# Patient Record
Sex: Female | Born: 1950 | Race: White | Hispanic: No | Marital: Single | Smoking: Never smoker
Health system: Southern US, Community
[De-identification: ages and names within clinical notes are randomized; demographics above are authoritative.]

## PROBLEM LIST (undated history)

## (undated) DIAGNOSIS — D649 Anemia, unspecified: Secondary | ICD-10-CM

## (undated) DIAGNOSIS — E039 Hypothyroidism, unspecified: Secondary | ICD-10-CM

## (undated) DIAGNOSIS — K649 Unspecified hemorrhoids: Secondary | ICD-10-CM

## (undated) DIAGNOSIS — F419 Anxiety disorder, unspecified: Secondary | ICD-10-CM

## (undated) HISTORY — PX: BREAST SURGERY: SHX581

## (undated) HISTORY — DX: Unspecified hemorrhoids: K64.9

## (undated) HISTORY — PX: STAPEDECTOMY: SHX2435

## (undated) HISTORY — DX: Anxiety disorder, unspecified: F41.9

## (undated) HISTORY — DX: Hypothyroidism, unspecified: E03.9

---

## 2005-05-16 HISTORY — PX: COLONOSCOPY: SHX174

## 2007-11-04 ENCOUNTER — Emergency Department (HOSPITAL_COMMUNITY): Admission: EM | Admit: 2007-11-04 | Discharge: 2007-11-04 | Payer: Self-pay | Admitting: Emergency Medicine

## 2007-11-07 ENCOUNTER — Emergency Department (HOSPITAL_COMMUNITY): Admission: EM | Admit: 2007-11-07 | Discharge: 2007-11-07 | Payer: Self-pay | Admitting: Emergency Medicine

## 2010-02-10 ENCOUNTER — Encounter: Admission: RE | Admit: 2010-02-10 | Discharge: 2010-02-10 | Payer: Self-pay | Admitting: Internal Medicine

## 2011-02-10 LAB — URINALYSIS, ROUTINE W REFLEX MICROSCOPIC
Bilirubin Urine: NEGATIVE
Glucose, UA: NEGATIVE
Ketones, ur: NEGATIVE
Leukocytes, UA: NEGATIVE
Nitrite: NEGATIVE
Specific Gravity, Urine: <1.005 — ABNORMAL LOW
pH: 5

## 2011-02-10 LAB — CSF CELL COUNT WITH DIFFERENTIAL
RBC Count, CSF: 18 — ABNORMAL HIGH
WBC, CSF: <10 — ABNORMAL HIGH

## 2011-02-10 LAB — BASIC METABOLIC PANEL
BUN: 10
Chloride: 102
Creatinine, Ser: 0.76
Creatinine, Ser: 0.92
GFR calc Af Amer: >60
GFR calc non Af Amer: >60
Glucose, Bld: 102 — ABNORMAL HIGH
Potassium: 3.7
Potassium: 4.2
Sodium: 134 — ABNORMAL LOW

## 2011-02-10 LAB — URINE MICROSCOPIC-ADD ON

## 2011-02-10 LAB — CSF CULTURE W GRAM STAIN: Culture: NO GROWTH

## 2011-02-10 LAB — CBC
HCT: 37.8
Hemoglobin: 12.2
MCV: 85.4
MCV: 86
Platelets: 178
RBC: 4.07
RDW: 13.6
WBC: 4.2
WBC: 5.2

## 2011-02-10 LAB — DIFFERENTIAL
Basophils Absolute: 0
Eosinophils Absolute: 0
Eosinophils Absolute: 0
Eosinophils Relative: 0
Lymphocytes Relative: 19
Lymphs Abs: 0.8
Lymphs Abs: 2.6
Monocytes Absolute: 0.5
Monocytes Relative: 9
Neutrophils Relative %: 40 — ABNORMAL LOW
Neutrophils Relative %: 74

## 2011-02-10 LAB — AFB CULTURE WITH SMEAR (NOT AT ARMC)

## 2011-02-10 LAB — FUNGUS CULTURE W SMEAR

## 2011-02-10 LAB — PROTEIN AND GLUCOSE, CSF: Glucose, CSF: 56

## 2011-05-19 ENCOUNTER — Other Ambulatory Visit (HOSPITAL_COMMUNITY): Payer: Self-pay | Admitting: Internal Medicine

## 2011-05-19 DIAGNOSIS — Z139 Encounter for screening, unspecified: Secondary | ICD-10-CM

## 2011-05-24 ENCOUNTER — Ambulatory Visit (HOSPITAL_COMMUNITY)
Admission: RE | Admit: 2011-05-24 | Discharge: 2011-05-24 | Disposition: A | Discharge status: Home / Self Care | Payer: Federal, State, Local not specified - PPO | Source: Ambulatory Visit | Attending: Internal Medicine | Admitting: Internal Medicine

## 2011-05-24 DIAGNOSIS — Z1231 Encounter for screening mammogram for malignant neoplasm of breast: Secondary | ICD-10-CM | POA: Insufficient documentation

## 2011-05-24 DIAGNOSIS — Z139 Encounter for screening, unspecified: Secondary | ICD-10-CM

## 2012-10-01 ENCOUNTER — Other Ambulatory Visit (HOSPITAL_COMMUNITY): Payer: Self-pay | Admitting: Internal Medicine

## 2012-10-01 DIAGNOSIS — Z139 Encounter for screening, unspecified: Secondary | ICD-10-CM

## 2012-10-09 ENCOUNTER — Ambulatory Visit (HOSPITAL_COMMUNITY)
Admission: RE | Admit: 2012-10-09 | Discharge: 2012-10-09 | Disposition: A | Discharge status: Home / Self Care | Payer: Federal, State, Local not specified - PPO | Source: Ambulatory Visit | Attending: Internal Medicine | Admitting: Internal Medicine

## 2012-10-09 DIAGNOSIS — Z139 Encounter for screening, unspecified: Secondary | ICD-10-CM

## 2012-10-09 DIAGNOSIS — Z1231 Encounter for screening mammogram for malignant neoplasm of breast: Secondary | ICD-10-CM | POA: Insufficient documentation

## 2013-05-31 ENCOUNTER — Other Ambulatory Visit (HOSPITAL_COMMUNITY): Payer: Self-pay | Admitting: Internal Medicine

## 2013-05-31 DIAGNOSIS — N644 Mastodynia: Secondary | ICD-10-CM

## 2013-06-12 ENCOUNTER — Ambulatory Visit (HOSPITAL_COMMUNITY)
Admission: RE | Admit: 2013-06-12 | Discharge: 2013-06-12 | Disposition: A | Discharge status: Home / Self Care | Payer: Federal, State, Local not specified - PPO | Source: Ambulatory Visit | Attending: Internal Medicine | Admitting: Internal Medicine

## 2013-06-12 ENCOUNTER — Other Ambulatory Visit (HOSPITAL_COMMUNITY): Payer: Self-pay | Admitting: Internal Medicine

## 2013-06-12 DIAGNOSIS — N6489 Other specified disorders of breast: Secondary | ICD-10-CM | POA: Insufficient documentation

## 2013-06-12 DIAGNOSIS — N644 Mastodynia: Secondary | ICD-10-CM

## 2013-06-20 ENCOUNTER — Encounter: Payer: Self-pay | Admitting: Gastroenterology

## 2013-06-20 ENCOUNTER — Encounter (INDEPENDENT_AMBULATORY_CARE_PROVIDER_SITE_OTHER): Payer: Self-pay

## 2013-06-20 ENCOUNTER — Ambulatory Visit (INDEPENDENT_AMBULATORY_CARE_PROVIDER_SITE_OTHER): Payer: Federal, State, Local not specified - PPO | Admitting: Gastroenterology

## 2013-06-20 VITALS — BP 131/73 | HR 52 | Temp 97.9°F | Ht 64.0 in | Wt 133.4 lb

## 2013-06-20 DIAGNOSIS — K648 Other hemorrhoids: Secondary | ICD-10-CM | POA: Insufficient documentation

## 2013-06-20 NOTE — Assessment & Plan Note (Signed)
R ANT BAND IN 2 WEEKS. WILL LIKELY NEED 4 BANDS.

## 2013-06-20 NOTE — Progress Notes (Signed)
   Subjective:    Patient ID: Stacey Tucker, female    DOB: 1950/05/29, 63 y.o.   MRN: [id number] Stacey Cahill, MD  HPI HAVE BOUTS OF BLEEDING EVERY DAY AND THEN MAY STOP FOR 10 YEARS. BMs: 1/DAY. NO RECTAL  PRESSURE, PAIN, ITCHING, BURNING, OR SOILING. PT DENIES FEVER, CHILLS, nausea, vomiting, melena, diarrhea, constipation, abd pain, problems swallowing, OR heartburn or indigestion.    Past Medical History  Diagnosis Date  . Hemorrhoids   . Hypothyroidism AGE 57  . Anxiety     Past Surgical History  Procedure Laterality Date  . Colonoscopy  2007    [facility]     Allergies  Allergen Reactions  . Codeine   . Sulfa Antibiotics     Current Outpatient Prescriptions  Medication Sig Dispense Refill  . KRILL OIL PO Take by mouth daily.      Marland Kitchen levothyroxine (SYNTHROID, LEVOTHROID) 100 MCG tablet Take 100 mcg by mouth daily before breakfast.       . Multiple Vitamin (MULTIVITAMIN) capsule Take 1 capsule by mouth daily.      Marland Kitchen venlafaxine XR (EFFEXOR-XR) 75 MG 24 hr capsule Take 75 mg by mouth daily with breakfast.        History  Substance Use Topics  . Smoking status: Never Smoker   . Smokeless tobacco: Not on file  . Alcohol Use: No  NO CHILDREN      Review of Systems PER HPI OTHERWISE ALL SYSTEMS ARE NEGATIVE.     Objective:   Physical Exam  Vitals reviewed. Constitutional: She is oriented to person, place, and time. She appears well-nourished. No distress.  HENT:  Head: Normocephalic and atraumatic.  Mouth/Throat: Oropharynx is clear and moist. No oropharyngeal exudate.  Eyes: Pupils are equal, round, and reactive to light. No scleral icterus.  Neck: Normal range of motion. Neck supple.  Cardiovascular: Normal rate, regular rhythm and normal heart sounds.   Pulmonary/Chest: Effort normal and breath sounds normal. No respiratory distress.  Abdominal: Soft. Bowel sounds are normal. She exhibits no distension. There is no tenderness.  Musculoskeletal: She exhibits no  edema.  Lymphadenopathy:    She has no cervical adenopathy.  Neurological: She is alert and oriented to person, place, and time.  Psychiatric: She has a normal mood and affect.          Assessment & Plan:

## 2013-06-20 NOTE — Patient Instructions (Signed)
FOLLOW A HIGH FIBER DIET. AVOID ITEMS THAT CAUSE BLOATING AND GAS. SEE INFO BELOW.  DRINK WATER TO KEEP YOUR URINE LIGHT YELLOW.  CONSIDER USING FIBER POWDER OR 1 PACKET ONCE DAILY FOR 3 DAYS THEN TWICE DAILY FOR 3 DAYS THEN THREE TIMES A DAY. AVOID HIGHER DOSES IF IT CAUSES BLOATING & GAS.  FOLLOW UP IN 2 WEEKS.    High-Fiber Diet A high-fiber diet changes your normal diet to include more whole grains, legumes, fruits, and vegetables. Changes in the diet involve replacing refined carbohydrates with unrefined foods. The calorie level of the diet is essentially unchanged. The Dietary Reference Intake (recommended amount) for adult males is 38 grams per day. For adult females, it is 25 grams per day. Pregnant and lactating women should consume 28 grams of fiber per day. Fiber is the intact part of a plant that is not broken down during digestion. Functional fiber is fiber that has been isolated from the plant to provide a beneficial effect in the body. PURPOSE  Increase stool bulk.   Ease and regulate bowel movements.   Lower cholesterol.  INDICATIONS THAT YOU NEED MORE FIBER  Constipation and hemorrhoids.   Uncomplicated diverticulosis (intestine condition) and irritable bowel syndrome.   Weight management.   As a protective measure against hardening of the arteries (atherosclerosis), diabetes, and cancer.   DO NOT USE WITH:  Acute diverticulitis (intestine infection).   Partial small bowel obstructions.   Complicated diverticular disease involving bleeding, rupture (perforation), or abscess (boil, furuncle).   Presence of autonomic neuropathy (nerve damage) or gastroparesis (stomach cannot empty itself).    GUIDELINES FOR INCREASING FIBER IN THE DIET  Start adding fiber to the diet slowly. A gradual increase of about 5 more grams (2 slices of whole-wheat bread, 2 servings of most fruits or vegetables, or 1 bowl of high-fiber cereal) per day is best. Too rapid an increase in  fiber may result in constipation, flatulence, and bloating.   Drink enough water and fluids to keep your urine clear or pale yellow. Water, juice, or caffeine-free drinks are recommended. Not drinking enough fluid may cause constipation.   Eat a variety of high-fiber foods rather than one type of fiber.   Try to increase your intake of fiber through using high-fiber foods rather than fiber pills or supplements that contain small amounts of fiber.   The goal is to change the types of food eaten. Do not supplement your present diet with high-fiber foods, but replace foods in your present diet.    INCLUDE A VARIETY OF FIBER SOURCES  Replace refined and processed grains with whole grains, canned fruits with fresh fruits, and incorporate other fiber sources. White rice, white breads, and most bakery goods contain little or no fiber.   Brown whole-grain rice, buckwheat oats, and many fruits and vegetables are all good sources of fiber. These include: broccoli, Brussels sprouts, cabbage, cauliflower, beets, sweet potatoes, white potatoes (skin on), carrots, tomatoes, eggplant, squash, berries, fresh fruits, and dried fruits.   Cereals appear to be the richest source of fiber. Cereal fiber is found in whole grains and bran. Bran is the fiber-rich outer coat of cereal grain, which is largely removed in refining. In whole-grain cereals, the bran remains. In breakfast cereals, the largest amount of fiber is found in those with "bran" in their names. The fiber content is sometimes indicated on the label.   You may need to include additional fruits and vegetables each day.   In baking, for 1  cup white flour, you may use the following substitutions:   1 cup whole-wheat flour minus 2 tablespoons.   1/2 cup white flour plus 1/2 cup whole-wheat flour.

## 2013-06-20 NOTE — Progress Notes (Signed)
CONSTIPATION: NO DIARRHEA: NO  STRAINS WITH BMs: YES  TIME SPENT ON TOILET: 4-5 MINS TISSUE POKES OUT OF RECTUM: YES- PUSHES IT BACK IN FIBER SUPPLEMENTS: NO  GLASSES OF WATER/DAY: 6-8: NO   ADDITIONAL QUESTIONS:  LATEX ALLERGY: NO PREGNANT: NO ERECTILE DYSFUNCTION MEDS OR NITRATES: NO ANTICOAGULATION/ANTIPLATELET MEDS: NO DIAGNOSED WITH CROHN'S DISEASE, PROCTITIS, PORTAL HTN, OR ANAL/RECTAL CA: NO TAKING IMMUNOSUPPRESSANTS/XRT: NO   PLAN: 1. ANOSCOPY/[facility] BANDING TODAY   PROCEDURE TECHNIQUE: BENEFITS RISK EXPLAINED TO PT. ANOSCOPY PERFORMED. BULGING INTERNAL HEMORRHOID COLUMN IN THE LEFT LATERAL, R POSTERIOR AND ANTERIOR COLUMNS. ONE [facility] BAND PLACED IN RIGHT POSTERIOR POSITION. POST-BANDING RECTAL EXAM REVEALED GOOD PLACEMENT. EXAM NON-TENDER.

## 2013-06-24 NOTE — Progress Notes (Signed)
cc'd to pcp 

## 2013-06-25 NOTE — Progress Notes (Signed)
Pt is aware of next banding with [facility]

## 2013-07-04 ENCOUNTER — Ambulatory Visit (INDEPENDENT_AMBULATORY_CARE_PROVIDER_SITE_OTHER): Payer: Federal, State, Local not specified - PPO | Admitting: Gastroenterology

## 2013-07-04 ENCOUNTER — Encounter: Payer: Self-pay | Admitting: Gastroenterology

## 2013-07-04 VITALS — BP 109/69 | HR 55 | Temp 97.6°F | Wt 131.8 lb

## 2013-07-04 DIAGNOSIS — K648 Other hemorrhoids: Secondary | ICD-10-CM

## 2013-07-04 NOTE — Progress Notes (Addendum)
SYMPTOMS: no RECTAL BLEEDING, RARE PAIN. STILL COMING OUT.  CONSTIPATION: NO DIARRHEA: NO  STRAINS WITH BMs: NO  TIME SPENT ON TOILET: 5 MINS TISSUE POKES OUT OF RECTUM: YES- PUSH IT BACK IN FIBER SUPPLEMENTS: NO  GLASSES OF WATER/DAY: 6-8: NO   ADDITIONAL QUESTIONS:  LATEX ALLERGY: NO PREGNANT: NO ERECTILE DYSFUNCTION MEDS OR NITRATES: NO ANTICOAGULATION/ANTIPLATELET MEDS: NO DIAGNOSED WITH CROHN'S DISEASE, PROCTITIS, PORTAL HTN, OR ANAL/RECTAL CA: NO TAKING IMMUNOSUPPRESSANTS/XRT: NO  PLAN: 1. [facility] BANDING TODAY   PROCEDURE TECHNIQUE: BENEFITS RISK EXPLAINED TO PT. ONE [facility] BAND PLACED IN RIGHT ANTERIOR POSITION. POST-BANDING RECTAL EXAM REVEALED GOOD PLACEMENT. EXAM NON-TENDER.

## 2013-07-04 NOTE — Assessment & Plan Note (Signed)
R ANT BAND TODAY

## 2013-07-04 NOTE — Patient Instructions (Addendum)
FOLLOW A HIGH FIBER DIET. AVOID ITEMS THAT CAUSE BLOATING AND GAS.   DRINK WATER TO KEEP YOUR URINE LIGHT YELLOW.   FOLLOW UP IN 3 WEEKS.

## 2013-08-05 ENCOUNTER — Telehealth: Payer: Self-pay | Admitting: Gastroenterology

## 2013-08-05 NOTE — Telephone Encounter (Signed)
PLEASE CALL PT. [place] [facility] BANDING FOR APR 8 AT 1130.

## 2013-08-07 ENCOUNTER — Ambulatory Visit: Payer: Federal, State, Local not specified - PPO | Admitting: Gastroenterology

## 2013-08-21 ENCOUNTER — Encounter: Payer: Self-pay | Admitting: Gastroenterology

## 2013-08-21 ENCOUNTER — Ambulatory Visit (INDEPENDENT_AMBULATORY_CARE_PROVIDER_SITE_OTHER): Payer: Federal, State, Local not specified - PPO | Admitting: Gastroenterology

## 2013-08-21 ENCOUNTER — Encounter (INDEPENDENT_AMBULATORY_CARE_PROVIDER_SITE_OTHER): Payer: Self-pay

## 2013-08-21 VITALS — BP 107/72 | HR 57 | Temp 97.6°F | Ht 65.0 in | Wt 133.0 lb

## 2013-08-21 DIAGNOSIS — K648 Other hemorrhoids: Secondary | ICD-10-CM

## 2013-08-21 NOTE — Progress Notes (Signed)
SYMPTOMS: RECTAL BLEEDING: a tiny amount. Feel like bottom is better.   CONSTIPATION: NO DIARRHEA: no  STRAINS WITH BMs: no  TIME SPENT ON TOILET: 3 MINS TISSUE POKES OUT OF RECTUM: YES- very rare FIBER SUPPLEMENTS: NO  GLASSES OF WATER/DAY: 6-8   ADDITIONAL QUESTIONS:  LATEX ALLERGY: NO PREGNANT: NO ERECTILE DYSFUNCTION MEDS OR NITRATES: NO ANTICOAGULATION/ANTIPLATELET MEDS: NO DIAGNOSED WITH CROHN'S DISEASE, PROCTITIS, PORTAL HTN, OR ANAL/RECTAL CA: NO TAKING IMMUNOSUPPRESSANTS/XRT: NO  PLAN: 1. [facility] BANDING   PROCEDURE TECHNIQUE: BENEFITS RISK EXPLAINED TO PT. ONE [facility] BAND PLACED IN LEFT LATERAL POSITION. POST-BANDING RECTAL EXAM REVEALED GOOD PLACEMENT. EXAM NON-TENDER

## 2013-08-21 NOTE — Assessment & Plan Note (Signed)
DRINK WATER EAT FIBER OPV IN 4 WEEKS. WILL DISCUSS WHETHER ANOTHER BAND IS NEEDED.

## 2013-08-21 NOTE — Patient Instructions (Signed)
DRINK WATER TO KEEP YOUR URINE LIGHT YELLOW.  FOLLOW A HIGH FIBER DIET. AVOID ITEMS THAT CAUSE BLOATING AND GAS.   FOLLOW UP IN 4 WEEKS.

## 2013-08-21 NOTE — Progress Notes (Signed)
cc'd to pcp 

## 2013-08-22 NOTE — Progress Notes (Signed)
Pt is aware of OV on 5/7 at 0945 with [facility]

## 2013-09-19 ENCOUNTER — Ambulatory Visit: Payer: Federal, State, Local not specified - PPO | Admitting: Gastroenterology

## 2013-09-26 ENCOUNTER — Encounter (INDEPENDENT_AMBULATORY_CARE_PROVIDER_SITE_OTHER): Payer: Self-pay

## 2013-09-26 ENCOUNTER — Ambulatory Visit (INDEPENDENT_AMBULATORY_CARE_PROVIDER_SITE_OTHER): Payer: Federal, State, Local not specified - PPO | Admitting: Gastroenterology

## 2013-09-26 ENCOUNTER — Encounter: Payer: Self-pay | Admitting: Gastroenterology

## 2013-09-26 VITALS — BP 121/78 | HR 59 | Temp 98.3°F | Ht 65.0 in | Wt 132.2 lb

## 2013-09-26 DIAGNOSIS — K648 Other hemorrhoids: Secondary | ICD-10-CM

## 2013-09-26 NOTE — Progress Notes (Signed)
Reminder in epic °

## 2013-09-26 NOTE — Assessment & Plan Note (Signed)
SX RESOLVED  DRINK WATER EAT FIBER OPV IN 6 MOS

## 2013-09-26 NOTE — Patient Instructions (Signed)
DRINK WATER TO KEEP YOUR URINE LIGHT YELLOW.  FOLLOW A HIGH FIBER DIET.   IF YOU NEED EXTRA FIBER YOU COULD USE FIBER POWDER OR 1 PACKET ONCE DAILY FOR 3 DAYS THEN TWICE DAILY.  AVOID HIGHER DOSES IF IT CAUSES BLOATING & GAS.  FOLLOW UP IN 6 MOS.   High-Fiber Diet A high-fiber diet changes your normal diet to include more whole grains, legumes, fruits, and vegetables. Changes in the diet involve replacing refined carbohydrates with unrefined foods. The calorie level of the diet is essentially unchanged. The Dietary Reference Intake (recommended amount) for adult males is 38 grams per day. For adult females, it is 25 grams per day. Pregnant and lactating women should consume 28 grams of fiber per day. Fiber is the intact part of a plant that is not broken down during digestion. Functional fiber is fiber that has been isolated from the plant to provide a beneficial effect in the body. PURPOSE  Increase stool bulk.   Ease and regulate bowel movements.   Lower cholesterol.  INDICATIONS THAT YOU NEED MORE FIBER  Constipation and hemorrhoids.   Uncomplicated diverticulosis (intestine condition) and irritable bowel syndrome.   Weight management.   As a protective measure against hardening of the arteries (atherosclerosis), diabetes, and cancer.   GUIDELINES FOR INCREASING FIBER IN THE DIET  Start adding fiber to the diet slowly. A gradual increase of about 5 more grams (2 slices of whole-wheat bread, 2 servings of most fruits or vegetables, or 1 bowl of high-fiber cereal) per day is best. Too rapid an increase in fiber may result in constipation, flatulence, and bloating.   Drink enough water and fluids to keep your urine clear or pale yellow. Water, juice, or caffeine-free drinks are recommended. Not drinking enough fluid may cause constipation.   Eat a variety of high-fiber foods rather than one type of fiber.   Try to increase your intake of fiber through using high-fiber foods rather  than fiber pills or supplements that contain small amounts of fiber.   The goal is to change the types of food eaten. Do not supplement your present diet with high-fiber foods, but replace foods in your present diet.  INCLUDE A VARIETY OF FIBER SOURCES  Replace refined and processed grains with whole grains, canned fruits with fresh fruits, and incorporate other fiber sources. White rice, white breads, and most bakery goods contain little or no fiber.   Brown whole-grain rice, buckwheat oats, and many fruits and vegetables are all good sources of fiber. These include: broccoli, Brussels sprouts, cabbage, cauliflower, beets, sweet potatoes, white potatoes (skin on), carrots, tomatoes, eggplant, squash, berries, fresh fruits, and dried fruits.   Cereals appear to be the richest source of fiber. Cereal fiber is found in whole grains and bran. Bran is the fiber-rich outer coat of cereal grain, which is largely removed in refining. In whole-grain cereals, the bran remains. In breakfast cereals, the largest amount of fiber is found in those with "bran" in their names. The fiber content is sometimes indicated on the label.   You may need to include additional fruits and vegetables each day.   In baking, for 1 cup white flour, you may use the following substitutions:   1 cup whole-wheat flour minus 2 tablespoons.   1/2 cup white flour plus 1/2 cup whole-wheat flour.

## 2013-09-26 NOTE — Progress Notes (Signed)
   Subjective:    Patient ID: Stacey Tucker, female    DOB: Oct 16, 1950, 63 y.o.   MRN: [id number]  Delphina Cahill, MD  HPI LAST TIME HAD A BAND. IT HURT FOR 2-3 WEEKS AFTER, BUT NOW SX RESOLVED. NO RECTAL BLEEDING, PRESSURE, PAIN, ITCHING, BURNING, OR SOILING.  Past Medical History  Diagnosis Date  . Hemorrhoids   . Hypothyroidism AGE 42  . Anxiety    Past Surgical History  Procedure Laterality Date  . Colonoscopy  2007    [facility]     Allergies  Allergen Reactions  . Codeine   . Sulfa Antibiotics    Current Outpatient Prescriptions  Medication Sig Dispense Refill  . KRILL OIL PO Take by mouth daily.      Marland Kitchen levothyroxine (SYNTHROID, LEVOTHROID) 100 MCG tablet Take 100 mcg by mouth daily before breakfast.       . Multiple Vitamin (MULTIVITAMIN) capsule Take 1 capsule by mouth daily.      Marland Kitchen venlafaxine XR (EFFEXOR-XR) 75 MG 24 hr capsule Take 75 mg by mouth daily with breakfast.          Review of Systems     Objective:   Physical Exam  Vitals reviewed. Constitutional: She is oriented to person, place, and time. She appears well-nourished. No distress.  HENT:  Head: Normocephalic and atraumatic.  Mouth/Throat: Oropharynx is clear and moist. No oropharyngeal exudate.  Eyes: Pupils are equal, round, and reactive to light. No scleral icterus.  Neck: Normal range of motion. Neck supple.  Cardiovascular: Normal rate, regular rhythm and normal heart sounds.   Pulmonary/Chest: Effort normal and breath sounds normal.  Abdominal: Soft. Bowel sounds are normal. She exhibits no distension. There is no tenderness.  Lymphadenopathy:    She has no cervical adenopathy.  Neurological: She is alert and oriented to person, place, and time.  NO FOCAL DEFICITS   Psychiatric: She has a normal mood and affect.          Assessment & Plan:

## 2013-09-30 NOTE — Progress Notes (Signed)
cc'd to pcp 

## 2014-02-25 ENCOUNTER — Encounter: Payer: Self-pay | Admitting: Gastroenterology

## 2014-05-19 ENCOUNTER — Other Ambulatory Visit (HOSPITAL_COMMUNITY): Payer: Self-pay | Admitting: Internal Medicine

## 2014-05-19 DIAGNOSIS — Z09 Encounter for follow-up examination after completed treatment for conditions other than malignant neoplasm: Secondary | ICD-10-CM

## 2014-05-27 ENCOUNTER — Ambulatory Visit (HOSPITAL_COMMUNITY)
Admission: RE | Admit: 2014-05-27 | Discharge: 2014-05-27 | Disposition: A | Discharge status: Home / Self Care | Payer: Federal, State, Local not specified - PPO | Source: Ambulatory Visit | Attending: Internal Medicine | Admitting: Internal Medicine

## 2014-05-27 DIAGNOSIS — R928 Other abnormal and inconclusive findings on diagnostic imaging of breast: Secondary | ICD-10-CM | POA: Diagnosis present

## 2014-05-27 DIAGNOSIS — Z09 Encounter for follow-up examination after completed treatment for conditions other than malignant neoplasm: Secondary | ICD-10-CM

## 2014-06-04 ENCOUNTER — Encounter (HOSPITAL_COMMUNITY): Payer: Self-pay | Admitting: Emergency Medicine

## 2014-06-04 ENCOUNTER — Ambulatory Visit (INDEPENDENT_AMBULATORY_CARE_PROVIDER_SITE_OTHER): Payer: Federal, State, Local not specified - PPO | Admitting: Gastroenterology

## 2014-06-04 ENCOUNTER — Encounter: Payer: Self-pay | Admitting: Gastroenterology

## 2014-06-04 ENCOUNTER — Emergency Department (HOSPITAL_COMMUNITY)
Admission: EM | Admit: 2014-06-04 | Discharge: 2014-06-04 | Disposition: A | Discharge status: Home / Self Care | Payer: Federal, State, Local not specified - PPO | Attending: Emergency Medicine | Admitting: Emergency Medicine

## 2014-06-04 VITALS — BP 124/74 | HR 50 | Temp 97.1°F | Ht 64.0 in | Wt 132.2 lb

## 2014-06-04 DIAGNOSIS — E039 Hypothyroidism, unspecified: Secondary | ICD-10-CM | POA: Insufficient documentation

## 2014-06-04 DIAGNOSIS — Z79899 Other long term (current) drug therapy: Secondary | ICD-10-CM | POA: Diagnosis not present

## 2014-06-04 DIAGNOSIS — K648 Other hemorrhoids: Secondary | ICD-10-CM

## 2014-06-04 DIAGNOSIS — R55 Syncope and collapse: Secondary | ICD-10-CM

## 2014-06-04 DIAGNOSIS — F419 Anxiety disorder, unspecified: Secondary | ICD-10-CM | POA: Insufficient documentation

## 2014-06-04 DIAGNOSIS — Z8719 Personal history of other diseases of the digestive system: Secondary | ICD-10-CM | POA: Insufficient documentation

## 2014-06-04 LAB — URINALYSIS, ROUTINE W REFLEX MICROSCOPIC
Bilirubin Urine: NEGATIVE
GLUCOSE, UA: NEGATIVE mg/dL
Hgb urine dipstick: NEGATIVE
Leukocytes, UA: NEGATIVE
NITRITE: NEGATIVE
Protein, ur: NEGATIVE mg/dL
Specific Gravity, Urine: >1.03 — ABNORMAL HIGH (ref 1.005–1.030)
Urobilinogen, UA: 0.2 mg/dL (ref 0.0–1.0)
pH: 5.5 (ref 5.0–8.0)

## 2014-06-04 LAB — BASIC METABOLIC PANEL
Anion gap: 6 (ref 5–15)
BUN: 21 mg/dL (ref 6–23)
CO2: 27 mmol/L (ref 19–32)
Calcium: 9.2 mg/dL (ref 8.4–10.5)
Chloride: 106 mEq/L (ref 96–112)
Creatinine, Ser: 0.89 mg/dL (ref 0.50–1.10)
GFR calc Af Amer: 79 mL/min — ABNORMAL LOW (ref >90)
GFR calc non Af Amer: 69 mL/min — ABNORMAL LOW (ref >90)
Glucose, Bld: 107 mg/dL — ABNORMAL HIGH (ref 70–99)
Potassium: 4.1 mmol/L (ref 3.5–5.1)
Sodium: 139 mmol/L (ref 135–145)

## 2014-06-04 LAB — TROPONIN I: Troponin I: <0.03 ng/mL (ref <0.031)

## 2014-06-04 LAB — CBC WITH DIFFERENTIAL/PLATELET
Basophils Absolute: 0 10*3/uL (ref 0.0–0.1)
Basophils Relative: 0 % (ref 0–1)
EOS ABS: 0 10*3/uL (ref 0.0–0.7)
Eosinophils Relative: 0 % (ref 0–5)
HCT: 34.4 % — ABNORMAL LOW (ref 36.0–46.0)
HEMOGLOBIN: 11.7 g/dL — AB (ref 12.0–15.0)
LYMPHS ABS: 1.1 10*3/uL (ref 0.7–4.0)
LYMPHS PCT: 16 % (ref 12–46)
MCH: 29.9 pg (ref 26.0–34.0)
MCHC: 34 g/dL (ref 30.0–36.0)
MCV: 88 fL (ref 78.0–100.0)
MONOS PCT: 6 % (ref 3–12)
Monocytes Absolute: 0.4 10*3/uL (ref 0.1–1.0)
NEUTROS PCT: 78 % — AB (ref 43–77)
Neutro Abs: 5.6 10*3/uL (ref 1.7–7.7)
Platelets: 226 10*3/uL (ref 150–400)
RBC: 3.91 MIL/uL (ref 3.87–5.11)
RDW: 13.1 % (ref 11.5–15.5)
WBC: 7.2 10*3/uL (ref 4.0–10.5)

## 2014-06-04 LAB — TSH: TSH: 0.682 u[IU]/mL (ref 0.350–4.500)

## 2014-06-04 MED ORDER — SODIUM CHLORIDE 0.9 % IV BOLUS (SEPSIS)
1000.0000 mL | Freq: Once | INTRAVENOUS | Status: AC
  Administered 2014-06-04: 1000 mL via INTRAVENOUS

## 2014-06-04 NOTE — Patient Instructions (Signed)
DRINK WATER TO KEEP YOUR URINE LIGHT YELLOW.  EAT FIBER.  AVOID THINGS THAT MAKE HEMORRHOIDS WORSE. SEE INFO BELOW.  PLEASE CALL WITH QUESTIONS OR CONCERNS.  FOLLOW UP IN 3 WEEKS FOR ADDITIONAL ANOSCOPY AND BANDING.  Hemorrhoids Hemorrhoids are dilated (enlarged) veins around the rectum. Sometimes clots will form in the veins. This makes them swollen and painful. These are called thrombosed hemorrhoids. Causes of hemorrhoids include:  Constipation.   Straining to have a bowel movement.   HEAVY LIFTING  HOME CARE INSTRUCTIONS  Eat a well balanced diet and drink 6 to 8 glasses of water every day to avoid constipation. You may also use a bulk laxative.   Avoid straining to have bowel movements.   Keep anal area dry and clean.   Do not use a donut shaped pillow or sit on the toilet for long periods. This increases blood pooling and pain.   Move your bowels when your body has the urge; this will require less straining and will decrease pain and pressure.   DO NOT SIT ON THE COMMODE AND READ.

## 2014-06-04 NOTE — Progress Notes (Signed)
  PROCEDURE TECHNIQUE: BENEFITS RISK EXPLAINED TO PT. ANOSCOPY PERFORMED. BULGING INTERNAL HEMORRHOID COLUMN IN THE R POSTERIOR AND ANTERIOR COLUMNS < left lateral. ONE [facility] BAND PLACED IN RIGHT ANTERIOR THEN R POSTERIOR POSITION. POST-BANDING RECTAL EXAM REVEALED GOOD PLACEMENT. EXAM NON-TENDER.

## 2014-06-04 NOTE — Discharge Instructions (Signed)
Increase fluids. Eat regular meals. Follow-up your primary care doctor.

## 2014-06-04 NOTE — Progress Notes (Signed)
   Subjective:    Patient ID: Stacey Tucker, female    DOB: 1951/03/14, 64 y.o.   MRN: [id number]  Delphina Cahill, MD  HPI October GOT A VIRAL ILLNESS AND HAD A RELAPSED. HEMORRHOIDS-FALLING OUT AND ANNOYING. 2-3X/WEEK. DON'T HURT WHEN THEY FALL OUT. NO BLEEDING, OR PRESSURE.  BMs: NL.  PT DENIES FEVER, CHILLS, nausea, vomiting, melena, diarrhea, CHEST PAIN, SHORTNESS OF BREATH,  CHANGE IN BOWEL IN HABITS, constipation, abdominal pain, problems swallowing, OR heartburn or indigestion.  Past Medical History  Diagnosis Date  . Hemorrhoids 2015: [employer] x3  . Hypothyroidism AGE 82  . Anxiety    Past Surgical History  Procedure Laterality Date  . Colonoscopy  2007    [facility]    Allergies  Allergen Reactions  . Codeine   . Sulfa Antibiotics    Current Outpatient Prescriptions  Medication Sig Dispense Refill  . KRILL OIL PO Take by mouth daily.    Marland Kitchen levothyroxine (SYNTHROID, LEVOTHROID) 100 MCG tablet Take 100 mcg by mouth daily before breakfast.     . Multiple Vitamin (MULTIVITAMIN) capsule Take 1 capsule by mouth daily.    Marland Kitchen venlafaxine XR (EFFEXOR-XR) 75 MG 24 hr capsule Take 75 mg by mouth daily with breakfast.       Review of Systems     Objective:   Physical Exam  Constitutional: She is oriented to person, place, and time. She appears well-developed and well-nourished. No distress.  HENT:  Head: Normocephalic and atraumatic.  Mouth/Throat: Oropharynx is clear and moist. No oropharyngeal exudate.  Eyes: Pupils are equal, round, and reactive to light. No scleral icterus.  Neck: Normal range of motion. Neck supple.  Cardiovascular: Normal rate, regular rhythm and normal heart sounds.   Pulmonary/Chest: Effort normal and breath sounds normal. No respiratory distress.  Abdominal: Soft. Bowel sounds are normal. She exhibits no distension. There is no tenderness.  Musculoskeletal: She exhibits no edema.  Lymphadenopathy:    She has no cervical adenopathy.  Neurological: She is alert  and oriented to person, place, and time.  NO FOCAL DEFICITS   Psychiatric: She has a normal mood and affect.  Vitals reviewed.         Assessment & Plan:

## 2014-06-04 NOTE — ED Notes (Addendum)
Pt "passed out" 30 minutes ago after acute onset of feeling "nauseas and weird." Pt "had just gotten home from procedure to band hemroids at [facility]."  Pt reports "trying to make it to the bathroom, not making it, and waking up on the floor." Pt does not remember falling. Pt presents with hematoma on upper right eye lid. Pt states she "ran a lot yesterday and didn't drink enough water." Pt currently reports that she has a HA and denies any other symptoms.

## 2014-06-04 NOTE — Assessment & Plan Note (Signed)
SX UNCONTROLLED. ANOSCOPY REVEALED LARGE R POS AND R ANT HEMORRHOIDS.  [place] BANDING TODAY DRINK WATER EAT FIBER AVOID CONSTIPATION OPV IN 3 WEEKS

## 2014-06-04 NOTE — Progress Notes (Signed)
PATIENT SCHEDULED FOR BANDING

## 2014-06-04 NOTE — ED Provider Notes (Signed)
CSN: [id number]     Arrival date & time 06/04/14  1335 History  This chart was scribed for Nat Christen, MD by Lowella Petties, ED Scribe. The patient was seen in room APA06/APA06. Patient's care was started at 2:09 PM.   Chief Complaint  Patient presents with  . Loss of Consciousness   The history is provided by the patient. No language interpreter was used.   HPI Comments: Stacey Tucker is a 64 y.o. female who presents to the Emergency Department after a syncopal event and fall in her bathroom this afternoon. She states that she had a banding treatment for her hemmorhoids with Dr. Oneida Alar today, which was similar to these procedures which she has had in the past. She reports that after the procedure she began to feel nauseous and light headed, and when she was walking to her bathroom this afternoon she passed out and fell to the floor. Her friend states that when she found her immediatly after the incident she was pale and had a fever. She expresses concern about the bruising and blood under the skin round her right eye.  She reports having a few cookies and a coffee before going to Dr. Oneida Alar today. She denies any back, hip, or neck pain. She worked for the [employer] for 25 years.  PCP: Delphina Cahill, MD  Past Medical History  Diagnosis Date  . Hemorrhoids 2015: [place] x3  . Hypothyroidism AGE 44  . Anxiety    Past Surgical History  Procedure Laterality Date  . Colonoscopy  2007    [facility]    Family History  Problem Relation Age of Onset  . Colon cancer Neg Hx   . Colon polyps Neg Hx   .      History  Substance Use Topics  . Smoking status: Never Smoker   . Smokeless tobacco: Not on file  . Alcohol Use: No   OB History    No data available     Review of Systems  Constitutional: Negative for fever and chills.  Skin: Positive for color change (bruising around right eye).  Neurological: Positive for syncope.   A complete 10 system review of systems was obtained and all systems are negative  except as noted in the HPI and PMH.   Allergies  Codeine; Doxycycline; and Sulfa antibiotics  Home Medications   Prior to Admission medications   Medication Sig Start Date End Date Taking? Authorizing Provider  Ferrous Sulfate Dried 45 MG TBCR Take 1 tablet by mouth daily.   Yes Historical Provider, MD  ibuprofen (ADVIL,MOTRIN) 200 MG tablet Take 400 mg by mouth every 6 (six) hours as needed for moderate pain.   Yes Historical Provider, MD  KRILL OIL PO Take 2 tablets by mouth daily.    Yes Historical Provider, MD  L-LYSINE HCL PO Take 1 tablet by mouth daily.   Yes Historical Provider, MD  levothyroxine (SYNTHROID, LEVOTHROID) 100 MCG tablet Take 100 mcg by mouth daily before breakfast.  04/27/13  Yes Historical Provider, MD  Multiple Vitamin (MULTIVITAMIN) capsule Take 1 capsule by mouth daily.   Yes Historical Provider, MD  venlafaxine XR (EFFEXOR-XR) 75 MG 24 hr capsule Take 75 mg by mouth daily with breakfast.  05/21/13  Yes Historical Provider, MD   Triage Vitals: BP 127/70 mmHg  Pulse 50  Temp(Src) 98.3 F (36.8 C) (Oral)  Resp 16  Ht 5\' 4"  (1.626 m)  Wt 132 lb (59.875 kg)  BMI 22.65 kg/m2  SpO2 100% Physical Exam  Constitutional: She is oriented to person, place, and time. She appears well-developed and well-nourished.  HENT:  Head: Normocephalic and atraumatic.  Mouth/Throat: Oropharynx is clear and moist.  Eyes: Conjunctivae and EOM are normal. Pupils are equal, round, and reactive to light. No scleral icterus.  Neck: Normal range of motion. Neck supple.  Cardiovascular: Normal rate, regular rhythm and normal heart sounds.   No murmur heard. Pulmonary/Chest: Effort normal and breath sounds normal. No respiratory distress. She has no wheezes. She has no rales. She exhibits no tenderness.  Abdominal: Soft. Bowel sounds are normal. She exhibits no distension and no mass. There is no tenderness. There is no rebound and no guarding.  Musculoskeletal: Normal range of motion.  She exhibits no edema.  Neurological: She is alert and oriented to person, place, and time. She has normal reflexes. No cranial nerve deficit.  Skin: Skin is warm and dry.  Psychiatric: She has a normal mood and affect. Her behavior is normal.  Nursing note and vitals reviewed.   ED Course  Procedures (including critical care time) DIAGNOSTIC STUDIES: Oxygen Saturation is 100% on room air, normal by my interpretation.    COORDINATION OF CARE: 2:20 PM-Discussed treatment plan which includes lab work, IV Fluids and a TSH with pt at bedside and pt agreed to plan.   Results for orders placed or performed during the hospital encounter of 67/89/38  Basic metabolic panel  Result Value Ref Range   Sodium 139 135 - 145 mmol/L   Potassium 4.1 3.5 - 5.1 mmol/L   Chloride 106 96 - 112 mEq/L   CO2 27 19 - 32 mmol/L   Glucose, Bld 107 (H) 70 - 99 mg/dL   BUN 21 6 - 23 mg/dL   Creatinine, Ser 0.89 0.50 - 1.10 mg/dL   Calcium 9.2 8.4 - 10.5 mg/dL   GFR calc non Af Amer 69 (L) >90 mL/min   GFR calc Af Amer 79 (L) >90 mL/min   Anion gap 6 5 - 15  CBC with Differential  Result Value Ref Range   WBC 7.2 4.0 - 10.5 K/uL   RBC 3.91 3.87 - 5.11 MIL/uL   Hemoglobin 11.7 (L) 12.0 - 15.0 g/dL   HCT 34.4 (L) 36.0 - 46.0 %   MCV 88.0 78.0 - 100.0 fL   MCH 29.9 26.0 - 34.0 pg   MCHC 34.0 30.0 - 36.0 g/dL   RDW 13.1 11.5 - 15.5 %   Platelets 226 150 - 400 K/uL   Neutrophils Relative % 78 (H) 43 - 77 %   Neutro Abs 5.6 1.7 - 7.7 K/uL   Lymphocytes Relative 16 12 - 46 %   Lymphs Abs 1.1 0.7 - 4.0 K/uL   Monocytes Relative 6 3 - 12 %   Monocytes Absolute 0.4 0.1 - 1.0 K/uL   Eosinophils Relative 0 0 - 5 %   Eosinophils Absolute 0.0 0.0 - 0.7 K/uL   Basophils Relative 0 0 - 1 %   Basophils Absolute 0.0 0.0 - 0.1 K/uL  Troponin I  Result Value Ref Range   Troponin I <0.03 <0.031 ng/mL  Urinalysis, Routine w reflex microscopic  Result Value Ref Range   Color, Urine YELLOW YELLOW   APPearance  CLEAR CLEAR   Specific Gravity, Urine >1.030 (H) 1.005 - 1.030   pH 5.5 5.0 - 8.0   Glucose, UA NEGATIVE NEGATIVE mg/dL   Hgb urine dipstick NEGATIVE NEGATIVE   Bilirubin Urine NEGATIVE NEGATIVE   Ketones, ur TRACE (A) NEGATIVE  mg/dL   Protein, ur NEGATIVE NEGATIVE mg/dL   Urobilinogen, UA 0.2 0.0 - 1.0 mg/dL   Nitrite NEGATIVE NEGATIVE   Leukocytes, UA NEGATIVE NEGATIVE  TSH  Result Value Ref Range   TSH 0.682 0.350 - 4.500 uIU/mL   Mm Digital Diagnostic Bilat  05/27/2014   CLINICAL DATA:  Patient was seen previously 06/04/2013. It was recommended that the patient have a short-term follow-up for probably benign left breast asymmetry.  EXAM: DIGITAL DIAGNOSTIC  LEFT MAMMOGRAM WITH CAD  COMPARISON:  [facility]  ACR Breast Density Category d: The breast tissue is extremely dense, which lowers the sensitivity of mammography.  FINDINGS: Stable asymmetry within the posterior lateral aspect of the left breast. No concerning masses, calcifications or architectural distortion identified within either breast. Questioned asymmetry medial right breast posteriorly resolved with additional imaging, compatible with overlapping fibroglandular breast tissue.  Mammographic images were processed with CAD.  IMPRESSION: Stable focal asymmetry lateral left breast posterior depth.  RECOMMENDATION: Bilateral diagnostic mammography in 12 months to demonstrate 2 years of stability.  I have discussed the findings and recommendations with the patient. Results were also provided in writing at the conclusion of the visit. If applicable, a reminder letter will be sent to the patient regarding the next appointment.  BI-RADS CATEGORY  3: Probably benign finding(s) - short interval follow-up suggested.   Electronically Signed   By: Lovey Newcomer M.D.   On: 05/27/2014 13:19    Medications  sodium chloride 0.9 % bolus 1,000 mL (0 mLs Intravenous Stopped 06/04/14 1608)     EKG Interpretation   Date/Time:  Wednesday June 04 2014  14:41:58 EST Ventricular Rate:  54 PR Interval:  151 QRS Duration: 100 QT Interval:  573 QTC Calculation: 543 R Axis:   62 Text Interpretation:  Sinus rhythm Borderline T abnormalities, anterior  leads Prolonged QT interval Baseline wander in lead(s) V2 Confirmed by  Asna Muldrow  MD, Hasna Stefanik ([id number]) on 06/04/2014 3:12:59 PM      MDM   Final diagnoses:  Syncope, unspecified syncope type   Patient minimal oral intake prior to her hemorrhoid procedure today. This was followed by syncopal spell. Suspect low blood pressure as the cause. Her physical exam was normal. Hemoglobin stable. TSH within normal limits. EKG normal  I personally performed the services described in this documentation, which was scribed in my presence. The recorded information has been reviewed and is accurate.    Nat Christen, MD 06/04/14 [id number]

## 2014-06-09 ENCOUNTER — Telehealth: Payer: Self-pay

## 2014-06-09 NOTE — Telephone Encounter (Signed)
Dr. Oneida Alar called me from the hospital and said that she will be calling the pt shortly.

## 2014-06-09 NOTE — Telephone Encounter (Signed)
PT CALLED. HAD LOC AFTER GOING TO BATHROOM ON THE DAY OF HER HEMORRHOID BANDING. HAD RUN 6 MILES THAT MORNING. PT REPORTS SHE WAS DEHYDRATED AND HYPOTHERMIC. TOOK IT EASY. HER BUT HAS NOT STOPPED HURTING. PT BLEEDS WHEN SHE HAS A BM. NO PROBLEM URINATING.  MAY NEED TO CAUTERIZE A VESSEL. BED A LOT THE FIRST COUPLE OF DAYS. BUT BLEEDING IS BETTER.  OFFERED PT PAIN MEDS: VICODIN/PERCOCET BUT SHE DECLINED. PT AGREED TO COME FOR FLEX SIG TOMORROW. PT MAY HAVE FULL LIQUIDS UNTIL 9 AM. PT AGREES WITH LIGHT SEDATION. DISCUSSED PROCEDURE, BENEFITS, & RISKS: < 1% chance of medication reaction, PERFORATION, OR bleeding.

## 2014-06-09 NOTE — Telephone Encounter (Signed)
Paged Dr. Oneida Alar to call.

## 2014-06-09 NOTE — Telephone Encounter (Signed)
Pt called and said she had a terrible time after her banding last week. Said she had a lot of pain in her bottom. She also had some dehydration that she was sure from the day before from running and not getting enough to drink.  She ended up passing out and had to have EMS take her to the ED.  She is having some rectal bleeding and a lot of pain in her bottom. She wants to come in and let Dr. Oneida Alar check her out, but is aware that Dr. Oneida Alar is at the hospital today.  She said she wants to come in the afternoon so her friend can come with her and she won't be alone.  I told her I will let Dr. Oneida Alar know.  Will send pager to Dr. Oneida Alar also.

## 2014-06-09 NOTE — Progress Notes (Signed)
cc'ed to pcp °

## 2014-06-09 NOTE — Telephone Encounter (Signed)
Pt called to see if I had heard from Dr. Oneida Alar. I told her that she was doing procedures and I was giving her time to complete those and answer the box.  I told her that I will check with her in a little while if I have not heard from her. Pt still would like to see Dr. Oneida Alar at the office if possible this afternoon.

## 2014-06-10 ENCOUNTER — Ambulatory Visit (HOSPITAL_COMMUNITY)
Admission: AD | Admit: 2014-06-10 | Discharge: 2014-06-10 | Disposition: A | Discharge status: Home / Self Care | Payer: Federal, State, Local not specified - PPO | Source: Ambulatory Visit | Attending: Gastroenterology | Admitting: Gastroenterology

## 2014-06-10 ENCOUNTER — Encounter (HOSPITAL_COMMUNITY): Payer: Self-pay

## 2014-06-10 ENCOUNTER — Encounter (HOSPITAL_COMMUNITY)
Admission: AD | Disposition: A | Discharge status: Home / Self Care | Payer: Self-pay | Source: Ambulatory Visit | Attending: Gastroenterology

## 2014-06-10 ENCOUNTER — Other Ambulatory Visit: Payer: Self-pay

## 2014-06-10 DIAGNOSIS — Z886 Allergy status to analgesic agent status: Secondary | ICD-10-CM | POA: Diagnosis not present

## 2014-06-10 DIAGNOSIS — K625 Hemorrhage of anus and rectum: Secondary | ICD-10-CM | POA: Insufficient documentation

## 2014-06-10 DIAGNOSIS — Z882 Allergy status to sulfonamides status: Secondary | ICD-10-CM | POA: Insufficient documentation

## 2014-06-10 DIAGNOSIS — Z888 Allergy status to other drugs, medicaments and biological substances status: Secondary | ICD-10-CM | POA: Insufficient documentation

## 2014-06-10 DIAGNOSIS — E039 Hypothyroidism, unspecified: Secondary | ICD-10-CM | POA: Diagnosis not present

## 2014-06-10 DIAGNOSIS — F419 Anxiety disorder, unspecified: Secondary | ICD-10-CM | POA: Insufficient documentation

## 2014-06-10 DIAGNOSIS — K921 Melena: Secondary | ICD-10-CM

## 2014-06-10 DIAGNOSIS — K6289 Other specified diseases of anus and rectum: Secondary | ICD-10-CM | POA: Insufficient documentation

## 2014-06-10 DIAGNOSIS — K626 Ulcer of anus and rectum: Secondary | ICD-10-CM

## 2014-06-10 DIAGNOSIS — K649 Unspecified hemorrhoids: Secondary | ICD-10-CM

## 2014-06-10 HISTORY — PX: FLEXIBLE SIGMOIDOSCOPY: SHX5431

## 2014-06-10 SURGERY — SIGMOIDOSCOPY, FLEXIBLE
Anesthesia: Moderate Sedation

## 2014-06-10 MED ORDER — MIDAZOLAM HCL 5 MG/5ML IJ SOLN
INTRAMUSCULAR | Status: DC | PRN
  Administered 2014-06-10: 1 mg via INTRAVENOUS
  Administered 2014-06-10: 2 mg via INTRAVENOUS

## 2014-06-10 MED ORDER — MEPERIDINE HCL 100 MG/ML IJ SOLN
INTRAMUSCULAR | Status: DC | PRN
  Administered 2014-06-10: 25 mg via INTRAVENOUS

## 2014-06-10 MED ORDER — SIMETHICONE 40 MG/0.6ML PO SUSP
ORAL | Status: DC | PRN
  Administered 2014-06-10: 13:00:00

## 2014-06-10 MED ORDER — MEPERIDINE HCL 100 MG/ML IJ SOLN
INTRAMUSCULAR | Status: AC
  Filled 2014-06-10: qty 2

## 2014-06-10 MED ORDER — SODIUM CHLORIDE 0.9 % IV SOLN
INTRAVENOUS | Status: DC
  Administered 2014-06-10: 13:00:00 via INTRAVENOUS

## 2014-06-10 MED ORDER — MIDAZOLAM HCL 5 MG/5ML IJ SOLN
INTRAMUSCULAR | Status: AC
  Filled 2014-06-10: qty 10

## 2014-06-10 NOTE — Op Note (Signed)
[place], [id number]   FLEX SIGMOIDOSCOPY PROCEDURE REPORT  PATIENT: Stacey Tucker, Stacey Tucker  MR#: [id number] BIRTHDATE: 1950-08-20 , 63  yrs. old GENDER: female ENDOSCOPIST: Danie Binder, MD REFERRED BY: PROCEDURE DATE:  Jul 09, 2014 PROCEDURE:   Sigmoidoscopy, diagnostic INDICATIONS: MEDICATIONS:  DESCRIPTION OF PROCEDURE:    Physical exam was performed.  Informed consent was obtained from the patient after explaining the benefits, risks, and alternatives to procedure.  The patient was connected to monitor and placed in left lateral position. Continuous oxygen was provided by nasal cannula and IV medicine administered through an indwelling cannula.  After administration of sedation and rectal exam, the patients rectum was intubated and the EC-[id number] ([id number])  colonoscope was advanced under direct visualization to the sigmoid colonThe scope was removed slowly by carefully examining the color, texture, anatomy, and integrity mucosa on the way out.  The patient was recovered in endoscopy and discharged home in satisfactory condition.       PREP QUALITY:  COMPLICATIONS: None  ENDOSCOPIC IMPRESSION:   RECOMMENDATIONS:       _______________________________ eSignedDanie Binder, MD 2014-07-09 2:55 PM   CPT CODES: ICD CODES:  The ICD and CPT codes recommended by this software are interpretations from the data that the clinical staff has captured with the software.  The verification of the translation of this report to the ICD and CPT codes and modifiers is the sole responsibility of the health care institution and practicing physician where this report was generated.  [employer] will not be held responsible for the validity of the ICD and CPT codes included on this report.  AMA assumes no liability for data contained or not contained herein. CPT is a Designer, television/film set of the [employer].

## 2014-06-10 NOTE — Telephone Encounter (Signed)
Orders in computer for procedure. Spoke with pt and she is aware of arrival time at the hospital.

## 2014-06-10 NOTE — H&P (Signed)
  Primary Care Physician:  Delphina Cahill, MD Primary Gastroenterologist:  Dr. Oneida Alar  Pre-Procedure History & Physical: HPI:  Stacey Tucker is a 64 y.o. female here for RECTAL BLEEDING/pain.  Past Medical History  Diagnosis Date  . Hemorrhoids 2015: [place] x3  . Hypothyroidism AGE 41  . Anxiety    Past Surgical History  Procedure Laterality Date  . Colonoscopy  2007    [facility]    Prior to Admission medications   Medication Sig Start Date End Date Taking? Authorizing Provider  Ferrous Sulfate Dried 45 MG TBCR Take 1 tablet by mouth daily.    Historical Provider, MD  ibuprofen (ADVIL,MOTRIN) 200 MG tablet Take 400 mg by mouth every 6 (six) hours as needed for moderate pain.    Historical Provider, MD  KRILL OIL PO Take 2 tablets by mouth daily.     Historical Provider, MD  L-LYSINE HCL PO Take 1 tablet by mouth daily.    Historical Provider, MD  levothyroxine (SYNTHROID, LEVOTHROID) 100 MCG tablet Take 100 mcg by mouth daily before breakfast.  04/27/13   Historical Provider, MD  Multiple Vitamin (MULTIVITAMIN) capsule Take 1 capsule by mouth daily.    Historical Provider, MD  venlafaxine XR (EFFEXOR-XR) 75 MG 24 hr capsule Take 75 mg by mouth daily with breakfast.  05/21/13   Historical Provider, MD   Allergies as of 06/10/2014 - Review Complete 06/10/2014  Allergen Reaction Noted  . Codeine Nausea And Vomiting 06/20/2013  . Doxycycline Nausea And Vomiting 06/04/2014  . Sulfa antibiotics Rash 06/20/2013   Family History  Problem Relation Age of Onset  . Colon cancer Neg Hx   . Colon polyps Neg Hx   .      History   Social History  . Marital Status: Single    Spouse Name: N/A    Number of Children: N/A  . Years of Education: N/A   Occupational History  . Not on file.   Social History Main Topics  . Smoking status: Never Smoker   . Smokeless tobacco: Not on file  . Alcohol Use: No  . Drug Use: No  . Sexual Activity: Not on file   Other Topics Concern  . Not on file    Social History Narrative   Review of Systems: See HPI, otherwise negative ROS  Physical Exam: There were no vitals taken for this visit. General:   Alert,  pleasant and cooperative in NAD Head:  Normocephalic and atraumatic. Neck:  Supple; Lungs:  Clear throughout to auscultation.    Heart:  Regular rate and rhythm. Abdomen:  Soft, nontender and nondistended. Normal bowel sounds, without guarding, and without rebound.   Neurologic:  Alert and  oriented x4;  grossly normal neurologically.  Impression/Plan:    RECTAL BLEEDING/pain  PLAN:  1. FLEX SIG/?CAUTERY OF ULCER BASE TODAY

## 2014-06-10 NOTE — Discharge Instructions (Signed)
You STILL HAVE INTERNAL AND EXTERNAL hemorrhoids. ONE OF YOUR BANDS HAS NOT FALLEN OFF. YOU HAVE AN ULCER IN YOUR RECTUM DUE TO THE BAND. IT IS THE REASON YOU ARE SEEING BLEEDING & HAVING PAIN.   Use PREPARATION H SUPPOSITORIES TWICE DAILY FOR 7 DAYS to HELP relieve rectal pain.  FOLLOW A LOW RESIDUE DIET FOR 7 DAYS. SEE INFO BELOW.  USE COLACE 100 MG THREE TIMES A DAY TO SOFTEN STOOL. HOLD FOR DIARRHEA.  USE IBUPROFEN/TYLENOL AS NEEDED FOR PAIN RELIEF.  THE PAIN AND BLEEDING SHOULD IMPROVE OVER THE NEXT 7 DAYS. CALL IN 7 DAYS IF YOUR PAIN IS NOT BETTER.   ENDOSCOPY Care After Read the instructions outlined below and refer to this sheet in the next week. These discharge instructions provide you with general information on caring for yourself after you leave the hospital. While your treatment has been planned according to the most current medical practices available, unavoidable complications occasionally occur. If you have any problems or questions after discharge, call DR. Javontae Marlette, [phone].  ACTIVITY  You may resume your regular activity, but move at a slower pace for the next 24 hours.   Take frequent rest periods for the next 24 hours.   Walking will help get rid of the air and reduce the bloated feeling in your belly (abdomen).   No driving for 24 hours (because of the medicine (anesthesia) used during the test).   You may shower.   Do not sign any important legal documents or operate any machinery for 24 hours (because of the anesthesia used during the test).    NUTRITION  Drink plenty of fluids.   You may resume your normal diet as instructed by your doctor.   Begin with a light meal and progress to your normal diet. Heavy or fried foods are harder to digest and may make you feel sick to your stomach (nauseated).   Avoid alcoholic beverages for 24 hours or as instructed.    MEDICATIONS  You may resume your normal medications.   WHAT YOU CAN EXPECT  TODAY  Some feelings of bloating in the abdomen.   Passage of more gas than usual.   Spotting of blood in your stool or on the toilet paper  .  IF YOU HADBIOPSIES TAKEN  DURING THE SIGMOIDOSCOPY/UPPER ENDOSCOPY:  Eat a soft diet IF YOU HAVE NAUSEA, BLOATING, ABDOMINAL PAIN, OR VOMITING.    FINDING OUT THE RESULTS OF YOUR TEST Not all test results are available during your visit. DR. Oneida Alar WILL CALL YOU WITHIN 14 DAYS OF YOUR PROCEDUE WITH YOUR RESULTS. Do not assume everything is normal if you have not heard from DR. Kathreen Dileo, CALL HER OFFICE AT [phone].  SEEK IMMEDIATE MEDICAL ATTENTION AND CALL THE OFFICE: [phone] IF:  You have more than a spotting of blood in your stool.   Your belly is swollen (abdominal distention).   You are nauseated or vomiting.   You have a temperature over 101F.   You have abdominal pain or discomfort that is severe or gets worse throughout the day.   Low Fiber and Residue Restricted Diet A low fiber diet restricts foods that contain carbohydrates that are not digested in the small intestine. A diet containing about 10 g of fiber is considered low fiber. The diet needs to be individualized to suit patient tolerances and preferences and to avoid unnecessary restrictions. Generally, the foods emphasized in a low fiber diet have no skins or seeds. They may have been processed to remove  bran, germ, or husks. Cooking may not necessarily eliminate the fiber. Cooking may, in fact, enable a greater quantity of fiber to be consumed in a lesser volume. Legumes and nuts are also restricted. The term low residue has also been used to describe low fiber diets, although the two are not the same. Residue refers to any substance that adds to bowel (colonic) contents, such as sloughed cells and intestinal bacteria, in addition to fiber. Residue-containing foods, prunes and prune juice, milk, and connective tissue from meats may also need to be eliminated. It is  important to eliminate these foods during sudden (acute) attacks of inflammatory bowel disease, when there is a partial obstruction due to another reason, or when minimal fecal output is desired. When these problems are gone, a more normal diet may be used.  PURPOSE  Reduce stool weight and volume.   CHOOSING FOODS Check labels, especially on foods from the starch list. Often, dietary fiber content is listed with the Nutrition Facts panel.  Breads and Starches  Allowed: White, [place], and pita breads, plain rolls, buns, or sweet rolls, doughnuts, waffles, pancakes, bagels. Plain muffins, sweet breads, biscuits, matzoth. Flour. Soda, saltine, or graham crackers. Pretzels, rusks, melba toast, zwieback. Cooked cereals: cornmeal, farina, cream cereals. Dry cereals: refined corn, wheat, rice, and oat cereals (check label). Potatoes prepared any way without skins, refined macaroni, spaghetti, noodles, refined rice.   Avoid: Bread, rolls, or crackers made with whole-wheat, multigrains, rye, bran seeds, nuts, or coconut. Corn tortillas, table-shells. Corn chips, tortilla chips. Cereals containing whole-grains, multigrains, bran, coconut, nuts, or raisins. Cooked or dry oatmeal. Coarse wheat cereals, granola. Cereals advertised as "high fiber." Potato skins. Whole-grain pasta, wild or brown rice. Popcorn.  Vegetables  Allowed:  Strained tomato and vegetable juices. Fresh: tender lettuce, cucumber, cabbage, spinach, bean sprouts. Cooked, canned: asparagus, bean sprouts, cut green or wax beans, cauliflower, pumpkin, beets, mushrooms, olives, spinach, yellow squash, tomato, tomato sauce (no seeds), zucchini (peeled), turnips. Canned sweet potatoes. Small amounts of celery, onion, radish, and green pepper may be used. Keep servings limited to  cup.   Avoid: Fresh, cooked, or canned: artichokes, baked beans, beet greens, broccoli, Brussels sprouts, French-style green beans, corn, kale, legumes, peas, sweet  potatoes. Cooked: green or red cabbage, spinach. Avoid large servings of any vegetables.  Fruit  Allowed:  All fruit juices except prune juice. Cooked or canned: apricots applesauce, cantaloupe, cherries, grapefruit, grapes, kiwi, mandarin oranges, peaches, pears, fruit cocktail, pineapple, plums, watermelon. Fresh: banana, grapes, cantaloupe, avocado, cherries, pineapple, grapefruit, kiwi, nectarines, peaches, oranges, blueberries, plums. Keep servings limited to  cup or 1 piece.   Avoid: Fresh: apple with or without skin, apricots, mango, pears, raspberries, strawberries. Prune juice, stewed or dried prunes. Dried fruits, raisins, dates. Avoid large servings of all fresh fruits.  Meat and Meat Substitutes  Allowed:  Ground or well-cooked tender beef, ham, veal, lamb, pork, or poultry. Eggs, plain cheese. Fish, oysters, shrimp, lobster, other seafood. Liver, organ meats.   Avoid: Tough, fibrous meats with gristle. Peanut butter, smooth or chunky. Cheese with seeds, nuts, or other foods not allowed. Nuts, seeds, legumes, dried peas, beans, lentils.  Milk  Allowed:  All milk products except those not allowed. Milk and milk product consumption should be minimal when low residue is desired.   Avoid: Yogurt that contains nuts or seeds.  Soups and Combination Foods  Allowed:  Bouillon, broth, or cream soups made from allowed foods. Any strained soup. Casseroles or mixed dishes made with allowed  foods.   Avoid: Soups made from vegetables that are not allowed or that contain other foods not allowed.  Desserts and Sweets  Allowed:  Plain cakes and cookies, pie made with allowed fruit, pudding, custard, cream pie. Gelatin, fruit, ice, sherbet, frozen ice pops. Ice cream, ice milk without nuts. Plain hard candy, honey, jelly, molasses, syrup, sugar, chocolate syrup, gumdrops, marshmallows.   Avoid: Desserts, cookies, or candies that contain nuts, peanut butter, or dried fruits. Jams, preserves with  seeds, marmalade.  Fats and Oils  Allowed:  Margarine, butter, cream, mayonnaise, salad oils, plain salad dressings made from allowed foods. Plain gravy, crisp bacon without rind.   Avoid: Seeds, nuts, olives. Avocados.  Beverages  Allowed:  All, except those listed to avoid.   Avoid: Fruit juices with high pulp, prune juice.  Condiments  Allowed:  Ketchup, mustard, horseradish, vinegar, cream sauce, cheese sauce, cocoa powder. Spices in moderation: allspice, basil, bay leaves, celery powder or leaves, cinnamon, cumin powder, curry powder, ginger, mace, marjoram, onion or garlic powder, oregano, paprika, parsley flakes, ground pepper, rosemary, sage, savory, tarragon, thyme, turmeric.   Avoid: Coconut, pickles.  SAMPLE MEAL PLAN The following menu is provided as a sample. Your daily menu plans will vary. Be sure to include a minimum of the following each day in order to provide essential nutrients for the adult:  Starch/Bread/Cereal Group, 6 servings.   Fruit/Vegetable Group, 5 servings.   Meat/Meat Substitute Group, 2 servings.   Milk/Milk Substitute Group, 2 servings.  A serving is equal to  cup for fruits, vegetables, and cooked cereals or 1 piece for foods such as a piece of bread, 1 orange, or 1 apple. For dry cereals and crackers, use serving sizes listed on the label. Combination foods may count as full or partial servings from various food groups. Fats, desserts, and sweets may be added to the meal plan after the requirements for essential nutrients are met. SAMPLE MENU Breakfast   cup orange juice.   1 boiled egg.   1 slice white toast.   Margarine.    cup cornflakes.   1 cup milk.   Beverage.  Lunch   cup chicken noodle soup.   2 to 3 oz sliced roast beef.   2 slices seedless rye bread.   Mayonnaise.    cup tomato juice.   1 small banana.   Beverage.  Dinner  3 oz baked chicken.    cup scalloped potatoes.    cup cooked beets.   White  dinner roll.   Margarine.    cup canned peaches.   Beverage.

## 2014-06-11 ENCOUNTER — Encounter (HOSPITAL_COMMUNITY): Payer: Self-pay | Admitting: Gastroenterology

## 2014-06-11 NOTE — Op Note (Signed)
NAMEEVALIE, HARGRAVES             ACCOUNT NO.:  [id number]  MEDICAL RECORD NO.:  [id number]  LOCATION:  [facility]                          FACILITY:  [facility]  PHYSICIAN:  Barney Drain, M.D.     DATE OF BIRTH:  08-17-1950  DATE OF PROCEDURE:  06/10/2014 DATE OF DISCHARGE:  06/10/2014                              OPERATIVE REPORT   REFERRING PROVIDER:  Delphina Cahill, MD  PROCEDURE:  Flexible sigmoidoscopy.  INDICATION FOR EXAM:  Ms. Yebra is a 64 year old female who had [place] banding performed in the office on June 04, 2014.  She presents with continued rectal pain and bleeding.  Her post hemorrhoid banding course has been complicated by an episode of syncope in which she experienced closed head injury.  She subsequently developed a hematoma and ecchymoses on the right eye.  Her bleeding is slightly improved but her pain persists.  She declined narcotics for pain relief.  Procedure is being performed to evaluate the need to cauterize the vessel that may be bleeding.  FINDINGS: 1. Two ulcers seen on retroflexed view in the rectum.  One band is     still present.  She has an irregular shaped ulcer in her rectum     which is most distal edge involved the dentate line.  A small     adherent clot seen, no visible vessel and no active bleeding. 2. Normal sigmoid colon to 40 cm from the anal verge. 3. One column of large internal hemorrhoids present.  External     hemorrhoids present.  IMPRESSION: 1. Rectal bleeding/pain due to rectal ulcer caused by hemorrhoid     banding. 2. One Hemorrhoid band still in place without evidence of active     bleeding.  RECOMMENDATIONS: 1. Preparation H suppositories twice daily for 7 days to help relieve     rectal pain. 2. Follow low residue diet for 7 days. 3. Use Colace 100 mg 3 times a day to soften stool.  Hold for     diarrhea. 4. The patient may use ibuprofen and/or Tylenol as needed for pain     relief. 5. I expect the pain and bleeding to  improve over the next 7 days.     The patient should call in 7 days if her pain is not better. 6. She has a followup appointment in February 2016.  She would like to     hold off on additional banding until after she returns from [place].     She will be provided a prescription for Anusol suppositories to     take with her in case she has a flare of her hemorrhoids.  MEDICATIONS: 1. Demerol 25 mg IV. 2. Versed 3 mg IV.  PROCEDURE TECHNIQUE:  Physical exam was performed.  Informed consent was obtained.  The patient explained the benefits, risks, and alternatives to the procedure. The patient was connected to a monitor and placed in left lateral position.  Continuous oxygen was provided by nasal cannula and IV medicine administered through an indwelling cannula.  After administration of sedation, the patient's rectum was intubated and scope was advanced under direct visualization to the sigmoid colon.  The scope was removed followed by careful  exam of color, texture, anatomy, and integrity of mucosal lay off.  The patient was recovered in endoscopy and discharged home in satisfactory condition.     Barney Drain, M.D.     SF/MEDQ  D:  06/10/2014  T:  06/11/2014  Job:  [id number]  cc:   Delphina Cahill, M.D. Fax: [phone]

## 2014-06-16 ENCOUNTER — Telehealth: Payer: Self-pay | Admitting: Gastroenterology

## 2014-06-16 NOTE — Telephone Encounter (Signed)
Pt called this afternoon saying that she is still in a tremendous amount of pain and asked to be seen this week. She is aware that she will be seeing [facility] on 2/2 at 9am and not [facility].

## 2014-06-16 NOTE — Telephone Encounter (Signed)
SPOKE TO PT. DEVELOPED A HUGE AMOUNT OF SWELLING AFTER FLEX SIG LAST TUES. PT NEEDS TO COME TO SHORT STAY FOR EXAM AT 9 AM ON TUE FEB 2. DECLINED PAIN MEDS. MAY NEED SURGICAL EVAL IN PREOP OR POSTOP. CANCEL OPV W/ LL.

## 2014-06-16 NOTE — Telephone Encounter (Signed)
CANCELLED APPOINTMENT

## 2014-06-16 NOTE — Telephone Encounter (Signed)
I spoke to pt. She said that she has large swollen area in her rectum and the pain is brutal. She thinks it must be a hemorrhoid. Some bright red blood whenever she wipes.She is aware of OV appt with Neil Crouch, PA tomorrow, but wants me to see if Dr. Oneida Alar has any other recommendations before then.

## 2014-06-17 ENCOUNTER — Ambulatory Visit: Payer: Federal, State, Local not specified - PPO | Admitting: Gastroenterology

## 2014-06-17 MED ORDER — LIDOCAINE-HYDROCORTISONE ACE 3-2.5 % RE KIT: PACK | RECTAL | Status: DC

## 2014-06-17 NOTE — Telephone Encounter (Signed)
I faxed over her information to Dr. Arnoldo Morale office

## 2014-06-17 NOTE — Telephone Encounter (Signed)
PT SEEN AND EVALUATED. RX FOR ANA-MANTLE SENT.  FOLLOW UP WITH DR. Georgiann Mccoy FEB 4 AT [phone] [id number] TK # [address] [place], [place] [address], PH#: [phone].

## 2014-06-17 NOTE — Addendum Note (Signed)
Addended by: Danie Binder on: 06/17/2014 09:43 AM   Modules accepted: Orders

## 2014-06-20 ENCOUNTER — Encounter (HOSPITAL_COMMUNITY): Payer: Self-pay

## 2014-06-20 ENCOUNTER — Encounter (HOSPITAL_COMMUNITY)
Admission: RE | Admit: 2014-06-20 | Discharge: 2014-06-20 | Disposition: A | Discharge status: Home / Self Care | Payer: Federal, State, Local not specified - PPO | Source: Ambulatory Visit | Attending: General Surgery | Admitting: General Surgery

## 2014-06-20 HISTORY — DX: Anemia, unspecified: D64.9

## 2014-06-20 NOTE — H&P (Signed)
NTS SOAP Note  Vital Signs:  Vitals as of: 09/19/9036: Systolic 333: Diastolic 75: Heart Rate 54: Temp 96.77F: Height 85ft 4in: Weight 134Lbs 0 Ounces: Pain Level 8: BMI 23  BMI : 23 kg/m2  Subjective: This 64 year old female presents for of a thrombosed hemorrhoid.  Has had hemorrhoidal banding in the past.  Started having swelling after her recent banding.  Has been applying cream to the hemorrhoid.  Painful.  Review of Symptoms:  Constitutional:unremarkable   Head:unremarkable Eyes:unremarkable   Nose/Mouth/Throat:unremarkable Cardiovascular:  unremarkable Respiratory:unremarkable Gastrointestinal:  unremarkable   Genitourinary:unremarkable   Musculoskeletal:unremarkable Skin:unremarkable Hematolgic/Lymphatic:unremarkable   Allergic/Immunologic:unremarkable   Past Medical History:  Reviewed  Past Medical History  Surgical History: none Medical Problems: hypothyroidism Allergies: sulfur,  codine Medications: synthroid,  venoflaxen   Social History:Reviewed  Social History  Preferred Language: English Race:  White Ethnicity: Not Hispanic / Latino Age: 66 year Marital Status:  M Alcohol: 3 glasses of wine per week   Smoking Status: Never smoker reviewed on 06/19/2014 Functional Status reviewed on 06/19/2014 ------------------------------------------------ Bathing: Normal Cooking: Normal Dressing: Normal Driving: Normal Eating: Normal Managing Meds: Normal Oral Care: Normal Shopping: Normal Toileting: Normal Transferring: Normal Walking: Normal Cognitive Status reviewed on 06/19/2014 ------------------------------------------------ Attention: Normal Decision Making: Normal Language: Normal Memory: Normal Motor: Normal Perception: Normal Problem Solving: Normal Visual and Spatial: Normal   Family History:Reviewed  Family Health History Mother  Father, Deceased; Brain tumor;     Objective Information: General:Well  appearing, well nourished in no distress. Heart:RRR, no murmur or gallop.  Normal S1, S2.  No S3, S4.  Lungs:  CTA bilaterally, no wheezes, rhonchi, rales.  Breathing unlabored. Abdomen:Soft, NT/ND, no HSM, no masses. Swollen internal/external hemorrhoid noted at 12 o'clock position.  Assessment:Thrombosed hemorrhoid  Diagnoses: 455.7  K64.5 Thrombosed hemorrhoids (Perianal venous thrombosis)  Procedures: 83291 - OFFICE OUTPATIENT NEW 30 MINUTES    Plan:  Scheduled for hemorrhoidectomy on 06/23/14.   Patient Education:Alternative treatments to surgery were discussed with patient (and family).  Risks and benefits  of procedure including bleeding,  infection,  and recurrence of the hemorrhoids were fully explained to the patient (and family) who gave informed consent. Patient/family questions were addressed.  Follow-up:Pending Surgery

## 2014-06-23 ENCOUNTER — Ambulatory Visit (HOSPITAL_COMMUNITY): Payer: Federal, State, Local not specified - PPO | Admitting: Anesthesiology

## 2014-06-23 ENCOUNTER — Encounter (HOSPITAL_COMMUNITY): Payer: Self-pay | Admitting: *Deleted

## 2014-06-23 ENCOUNTER — Encounter (HOSPITAL_COMMUNITY)
Admission: RE | Disposition: A | Discharge status: Home / Self Care | Payer: Self-pay | Source: Ambulatory Visit | Attending: General Surgery

## 2014-06-23 ENCOUNTER — Ambulatory Visit (HOSPITAL_COMMUNITY)
Admission: RE | Admit: 2014-06-23 | Discharge: 2014-06-23 | Disposition: A | Discharge status: Home / Self Care | Payer: Federal, State, Local not specified - PPO | Source: Ambulatory Visit | Attending: General Surgery | Admitting: General Surgery

## 2014-06-23 DIAGNOSIS — K648 Other hemorrhoids: Secondary | ICD-10-CM | POA: Insufficient documentation

## 2014-06-23 DIAGNOSIS — K644 Residual hemorrhoidal skin tags: Secondary | ICD-10-CM | POA: Diagnosis not present

## 2014-06-23 DIAGNOSIS — K645 Perianal venous thrombosis: Secondary | ICD-10-CM | POA: Diagnosis present

## 2014-06-23 DIAGNOSIS — Z862 Personal history of diseases of the blood and blood-forming organs and certain disorders involving the immune mechanism: Secondary | ICD-10-CM | POA: Insufficient documentation

## 2014-06-23 DIAGNOSIS — Z79899 Other long term (current) drug therapy: Secondary | ICD-10-CM | POA: Diagnosis not present

## 2014-06-23 DIAGNOSIS — E039 Hypothyroidism, unspecified: Secondary | ICD-10-CM | POA: Insufficient documentation

## 2014-06-23 HISTORY — PX: HEMORRHOID SURGERY: SHX153

## 2014-06-23 SURGERY — HEMORRHOIDECTOMY
Anesthesia: General

## 2014-06-23 MED ORDER — ONDANSETRON HCL 4 MG/2ML IJ SOLN
4.0000 mg | Freq: Once | INTRAMUSCULAR | Status: AC | PRN
  Administered 2014-06-23: 4 mg via INTRAVENOUS

## 2014-06-23 MED ORDER — LACTATED RINGERS IV SOLN
INTRAVENOUS | Status: DC | PRN
  Administered 2014-06-23: 08:00:00 via INTRAVENOUS

## 2014-06-23 MED ORDER — LACTATED RINGERS IV SOLN
INTRAVENOUS | Status: DC
  Administered 2014-06-23: 500 mL via INTRAVENOUS
  Administered 2014-06-23: 1000 mL via INTRAVENOUS

## 2014-06-23 MED ORDER — TRAMADOL HCL 50 MG PO TABS: 50.0000 mg | ORAL_TABLET | Freq: Four times a day (QID) | ORAL | Status: DC | PRN

## 2014-06-23 MED ORDER — BUPIVACAINE HCL (PF) 0.5 % IJ SOLN
INTRAMUSCULAR | Status: AC
  Filled 2014-06-23: qty 30

## 2014-06-23 MED ORDER — FENTANYL CITRATE 0.05 MG/ML IJ SOLN
25.0000 ug | INTRAMUSCULAR | Status: DC | PRN
  Administered 2014-06-23 (×4): 50 ug via INTRAVENOUS
  Filled 2014-06-23: qty 2

## 2014-06-23 MED ORDER — LIDOCAINE HCL 2 % EX GEL
CUTANEOUS | Status: AC
  Filled 2014-06-23: qty 30

## 2014-06-23 MED ORDER — FENTANYL CITRATE 0.05 MG/ML IJ SOLN
25.0000 ug | INTRAMUSCULAR | Status: AC
  Administered 2014-06-23 (×2): 25 ug via INTRAVENOUS

## 2014-06-23 MED ORDER — FENTANYL CITRATE 0.05 MG/ML IJ SOLN
INTRAMUSCULAR | Status: DC | PRN
  Administered 2014-06-23: 140 ug via INTRAVENOUS
  Administered 2014-06-23: 10 ug via INTRAVENOUS
  Administered 2014-06-23: 25 ug via INTRAVENOUS

## 2014-06-23 MED ORDER — FENTANYL CITRATE 0.05 MG/ML IJ SOLN
INTRAMUSCULAR | Status: AC
  Filled 2014-06-23: qty 2

## 2014-06-23 MED ORDER — ARTIFICIAL TEARS OP OINT
TOPICAL_OINTMENT | OPHTHALMIC | Status: AC
  Filled 2014-06-23: qty 3.5

## 2014-06-23 MED ORDER — ONDANSETRON HCL 4 MG/2ML IJ SOLN
INTRAMUSCULAR | Status: AC
  Filled 2014-06-23: qty 2

## 2014-06-23 MED ORDER — LIDOCAINE VISCOUS 2 % MT SOLN
OROMUCOSAL | Status: DC | PRN
  Administered 2014-06-23: 1

## 2014-06-23 MED ORDER — NALOXONE HCL 0.4 MG/ML IJ SOLN
INTRAMUSCULAR | Status: AC
  Filled 2014-06-23: qty 1

## 2014-06-23 MED ORDER — LIDOCAINE VISCOUS 2 % MT SOLN
OROMUCOSAL | Status: AC
  Filled 2014-06-23: qty 15

## 2014-06-23 MED ORDER — ONDANSETRON HCL 4 MG/2ML IJ SOLN
4.0000 mg | Freq: Once | INTRAMUSCULAR | Status: AC
  Administered 2014-06-23: 4 mg via INTRAVENOUS

## 2014-06-23 MED ORDER — LIDOCAINE HCL (CARDIAC) 10 MG/ML IV SOLN
INTRAVENOUS | Status: DC | PRN
  Administered 2014-06-23: 50 mg via INTRAVENOUS

## 2014-06-23 MED ORDER — BUPIVACAINE HCL (PF) 0.5 % IJ SOLN
INTRAMUSCULAR | Status: DC | PRN
  Administered 2014-06-23: 10 mL

## 2014-06-23 MED ORDER — SODIUM CHLORIDE 0.9 % IR SOLN
Status: DC | PRN
  Administered 2014-06-23: 1000 mL

## 2014-06-23 MED ORDER — MIDAZOLAM HCL 2 MG/2ML IJ SOLN
INTRAMUSCULAR | Status: AC
  Filled 2014-06-23: qty 2

## 2014-06-23 MED ORDER — METRONIDAZOLE IN NACL 5-0.79 MG/ML-% IV SOLN
INTRAVENOUS | Status: AC
  Filled 2014-06-23: qty 100

## 2014-06-23 MED ORDER — KETOROLAC TROMETHAMINE 30 MG/ML IJ SOLN
30.0000 mg | Freq: Once | INTRAMUSCULAR | Status: AC
  Administered 2014-06-23: 30 mg via INTRAVENOUS

## 2014-06-23 MED ORDER — PROPOFOL 10 MG/ML IV BOLUS
INTRAVENOUS | Status: AC
  Filled 2014-06-23: qty 20

## 2014-06-23 MED ORDER — KETOROLAC TROMETHAMINE 30 MG/ML IJ SOLN
INTRAMUSCULAR | Status: AC
  Filled 2014-06-23: qty 1

## 2014-06-23 MED ORDER — MIDAZOLAM HCL 2 MG/2ML IJ SOLN
1.0000 mg | INTRAMUSCULAR | Status: DC | PRN
  Administered 2014-06-23: 2 mg via INTRAVENOUS

## 2014-06-23 MED ORDER — METRONIDAZOLE IN NACL 5-0.79 MG/ML-% IV SOLN
500.0000 mg | INTRAVENOUS | Status: AC
  Administered 2014-06-23: 500 mg via INTRAVENOUS

## 2014-06-23 SURGICAL SUPPLY — 32 items
BAG HAMPER (MISCELLANEOUS) ×3 IMPLANT
CLOTH BEACON ORANGE TIMEOUT ST (SAFETY) ×3 IMPLANT
COVER LIGHT HANDLE STERIS (MISCELLANEOUS) ×6 IMPLANT
COVER MAYO STAND XLG (DRAPE) ×3 IMPLANT
DECANTER SPIKE VIAL GLASS SM (MISCELLANEOUS) ×3 IMPLANT
DRAPE PROXIMA HALF (DRAPES) ×3 IMPLANT
ELECT REM PT RETURN 9FT ADLT (ELECTROSURGICAL) ×3
ELECTRODE REM PT RTRN 9FT ADLT (ELECTROSURGICAL) ×1 IMPLANT
FORMALIN 10 PREFIL 120ML (MISCELLANEOUS) ×3 IMPLANT
GAUZE SPONGE 4X4 12PLY STRL (GAUZE/BANDAGES/DRESSINGS) ×2 IMPLANT
GLOVE BIOGEL PI IND STRL 7.0 (GLOVE) ×1 IMPLANT
GLOVE BIOGEL PI INDICATOR 7.0 (GLOVE) ×2
GLOVE ECLIPSE 6.5 STRL STRAW (GLOVE) ×3 IMPLANT
GLOVE EXAM NITRILE PF MED BLUE (GLOVE) ×3 IMPLANT
GLOVE SURG SS PI 7.5 STRL IVOR (GLOVE) ×6 IMPLANT
GOWN STRL REUS W/ TWL XL LVL3 (GOWN DISPOSABLE) ×1 IMPLANT
GOWN STRL REUS W/TWL LRG LVL3 (GOWN DISPOSABLE) ×3 IMPLANT
GOWN STRL REUS W/TWL XL LVL3 (GOWN DISPOSABLE) ×2
HEMOSTAT SURGICEL 4X8 (HEMOSTASIS) ×3 IMPLANT
KIT ROOM TURNOVER AP CYSTO (KITS) ×3 IMPLANT
LIGASURE IMPACT 36 18CM CVD LR (INSTRUMENTS) ×3 IMPLANT
MANIFOLD NEPTUNE II (INSTRUMENTS) ×3 IMPLANT
NEEDLE HYPO 25X1 1.5 SAFETY (NEEDLE) ×3 IMPLANT
NS IRRIG 1000ML POUR BTL (IV SOLUTION) ×3 IMPLANT
PACK PERI GYN (CUSTOM PROCEDURE TRAY) ×3 IMPLANT
PAD ARMBOARD 7.5X6 YLW CONV (MISCELLANEOUS) ×3 IMPLANT
SET BASIN LINEN APH (SET/KITS/TRAYS/PACK) ×3 IMPLANT
SPONGE GAUZE 4X4 12PLY (GAUZE/BANDAGES/DRESSINGS) ×3 IMPLANT
SURGILUBE 3G PEEL PACK STRL (MISCELLANEOUS) ×3 IMPLANT
SUT SILK 0 FSL (SUTURE) ×3 IMPLANT
SUT VIC AB 2-0 CT2 27 (SUTURE) IMPLANT
SYRINGE 10CC LL (SYRINGE) ×3 IMPLANT

## 2014-06-23 NOTE — Progress Notes (Signed)
Rectal packing d/c'd. No drainage. Tolerated well.

## 2014-06-23 NOTE — Discharge Instructions (Signed)
Hemorrhoidectomy Care After Hemorrhoidectomy is the removal of enlarged (dilated) veins around the rectum. Until the surgical areas are healed, control of pain and avoiding constipation are the greatest challenges for patients.  For as long as 24 hours after receiving an anesthetic (the medication that made you sleep), and while taking narcotic pain relievers, you may feel dizzy, weak and drowsy. For that reason, the following information applies to the first 24-hour period following surgery, and continues for as long as you are taking narcotic pain medications.  Do not drive a car, ride a bicycle, participate in activities in which you could be hurt. Do not take public transportation until you are off narcotic pain medications and until your caregiver says it is okay.  Do not drink alcohol, take tranquilizers, or medications not prescribed or allowed by your surgical caregiver.  Do not sign important papers or contracts for at least 24 hours or while taking narcotic medications.  Have a responsible person with you for 24 hours. RISKS AND COMPLICATIONS Some problems that may occur following this procedure include:  Infection. A germ starts growing in the tissue surrounding the site operated on. This can usually be treated with antibiotics.  Damage to the rectal sphincter could occur. This is the muscle that opens in your anus to allow a bowel movement. This could cause incontinence. This is uncommon.  Bleeding following surgery can be a complication of almost any surgery. Your surgeon takes every precaution to keep this from happening.  Complications of anesthesia. HOME CARE INSTRUCTIONS  Avoid straining when having bowel movements.  Avoid heavy lifting (more than 10 pounds (4.5 kilograms)).  Only take over-the-counter or prescription medicines for pain, discomfort, or fever as directed by your caregiver.  Take hot sitz baths for 20 to 30 minutes, 3 to 4 times per day.  To keep  swelling down, apply an ice pack for twenty minutes three to four times per day between sitz baths. Use a towel between your skin and the ice pack. Do not do this if it causes too much discomfort.  Keep anal area clean and dry. Following a bowel movement, you can gently wash the area with tucks (available for purchase at a drugstore) or cotton swabs. Gently pat the area dry. Do not rub the area.  Eat a well balanced diet and drink 6 to 8 glasses of water every day to avoid constipation. A bulk laxative may be also be helpful. SEEK MEDICAL CARE IF:   You have increasing pain or tenderness near or in the surgical site.  You are unable to eat or drink.  You develop nausea or vomiting.  You develop uncontrolled bleeding such as soaking two to three pads in one hour.  You have constipation, not helped by changing your diet or increasing your fluid intake. Pain medications are a common cause of constipation.  You have pain and redness (inflammation) extending outside the area of your surgery.  You develop an unexplained oral temperature above 102 F (38.9 C), or any other signs of infection.  You have any other questions or concerns following surgery. Document Released: 07/23/2003 Document Revised: 07/25/2011 Document Reviewed: 10/20/2008 [facility] Patient Information 2015 [place], [place]. This information is not intended to replace advice given to you by your health care provider. Make sure you discuss any questions you have with your health care provider.

## 2014-06-23 NOTE — Transfer of Care (Signed)
Immediate Anesthesia Transfer of Care Note  Patient: Stacey Tucker  Procedure(s) Performed: Procedure(s): EXTENSIVE HEMORRHOIDECTOMY (N/A)  Patient Location: PACU  Anesthesia Type:General  Level of Consciousness: awake, oriented and patient cooperative  Airway & Oxygen Therapy: Patient Spontanous Breathing  Post-op Assessment: Report given to RN and Post -op Vital signs reviewed and stable  Post vital signs: Reviewed and stable  Last Vitals:  Filed Vitals:   06/23/14 0840  BP: 126/79  Temp:   Resp: 32    Complications: No apparent anesthesia complications

## 2014-06-23 NOTE — Anesthesia Procedure Notes (Signed)
Procedure Name: LMA Insertion Date/Time: 06/23/2014 8:53 AM Performed by: Andree Elk, AMY A Pre-anesthesia Checklist: Patient identified, Timeout performed, Emergency Drugs available, Suction available and Patient being monitored Patient Re-evaluated:Patient Re-evaluated prior to inductionOxygen Delivery Method: Circle system utilized Preoxygenation: Pre-oxygenation with 100% oxygen Intubation Type: IV induction Ventilation: Mask ventilation without difficulty LMA Size: 3.0 Number of attempts: 1 Placement Confirmation: positive ETCO2 and breath sounds checked- equal and bilateral Tube secured with: Tape Dental Injury: Teeth and Oropharynx as per pre-operative assessment

## 2014-06-23 NOTE — Interval H&P Note (Signed)
History and Physical Interval Note:  06/23/2014 8:06 AM  Stacey Tucker  has presented today for surgery, with the diagnosis of thrombosed hemorrhoids  The various methods of treatment have been discussed with the patient and family. After consideration of risks, benefits and other options for treatment, the patient has consented to  Procedure(s): EXTENSIVE HEMORRHOIDECTOMY (N/A) as a surgical intervention .  The patient's history has been reviewed, patient examined, no change in status, stable for surgery.  I have reviewed the patient's chart and labs.  Questions were answered to the patient's satisfaction.     Aviva Signs A

## 2014-06-23 NOTE — Anesthesia Preprocedure Evaluation (Addendum)
Anesthesia Evaluation  Patient identified by MRN, date of birth, ID band Patient awake    Reviewed: Allergy & Precautions, NPO status , Patient's Chart, lab work & pertinent test results  Airway Mallampati: I  TM Distance: >3 FB     Dental  (+) Teeth Intact   Pulmonary neg pulmonary ROS,  breath sounds clear to auscultation        Cardiovascular negative cardio ROS  Rhythm:Regular Rate:Normal     Neuro/Psych PSYCHIATRIC DISORDERS Anxiety    GI/Hepatic negative GI ROS,   Endo/Other  Hypothyroidism   Renal/GU      Musculoskeletal   Abdominal   Peds  Hematology  (+) anemia ,   Anesthesia Other Findings   Reproductive/Obstetrics                            Anesthesia Physical Anesthesia Plan  ASA: II  Anesthesia Plan: General   Post-op Pain Management:    Induction: Intravenous  Airway Management Planned: LMA  Additional Equipment:   Intra-op Plan:   Post-operative Plan: Extubation in OR  Informed Consent: I have reviewed the patients History and Physical, chart, labs and discussed the procedure including the risks, benefits and alternatives for the proposed anesthesia with the patient or authorized representative who has indicated his/her understanding and acceptance.     Plan Discussed with:   Anesthesia Plan Comments:         Anesthesia Quick Evaluation

## 2014-06-23 NOTE — Op Note (Signed)
Patient:  Stacey Tucker  DOB:  [birth year]  MRN:  [id number]   Preop Diagnosis:  Hemorrhoidal disease  Postop Diagnosis:  Same  Procedure:  Extensive hemorrhoidectomy  Surgeon:  Aviva Signs, M.D.  Anes:  Gen.  Indications:  Patient is a 64 year old white female who is had multiple banding's of internal hemorrhoids in the past who now presents with internal and external hemorrhoidal disease. Risks and benefits of the procedure including bleeding, infection, and recurrence of the hemorrhoids were fully explained to the patient, who gave informed consent.  Procedure note:  The patient was placed in the lithotomy position after general anesthesia was administered. The perineum was prepped and draped using usual sterile technique with Betadine. Surgical site confirmation was performed.  On anoscopy, the patient had an internal and external hemorrhoid at the 11:00 position and at the 7:00 position. She had other less significant hemorrhoids present, with evidence of superficial rectal prolapse. The 2 columns of hemorrhoids were excised and a Ferguson like manner using the LigaSure. Both were sent to pathology further examination. No bleeding was noted. 0.5% Sensorcaine was instilled the surrounding wound. Surgicel and Viscous Xylocaine rectal packing was then placed.  All tape and needle counts were correct the end of the procedure. The patient was awakened and transferred to PACU in stable condition.  Complications:  None  EBL:  Minimal  Specimen:  Hemorrhoids

## 2014-06-23 NOTE — Anesthesia Postprocedure Evaluation (Signed)
  Anesthesia Post-op Note Late Entry Patient: Stacey Tucker  Procedure(s) Performed: Procedure(s): EXTENSIVE HEMORRHOIDECTOMY (N/A)  Patient Location: PACU  Anesthesia Type:General  Level of Consciousness: awake, alert , oriented and patient cooperative  Airway and Oxygen Therapy: Patient Spontanous Breathing  Post-op Pain: none  Post-op Assessment: Post-op Vital signs reviewed, Patient's Cardiovascular Status Stable, Respiratory Function Stable, Patent Airway, No signs of Nausea or vomiting and Pain level controlled  Post-op Vital Signs: Reviewed and stable  Last Vitals:  Filed Vitals:   06/23/14 1028  BP: 122/62  Pulse: 53  Temp: 36.5 C  Resp: 18    Complications: No apparent anesthesia complications

## 2014-06-23 NOTE — Progress Notes (Signed)
Refuses pos fluids.

## 2014-06-26 ENCOUNTER — Encounter: Payer: Federal, State, Local not specified - PPO | Admitting: Gastroenterology

## 2014-07-01 ENCOUNTER — Observation Stay (HOSPITAL_COMMUNITY)
Admission: EM | Admit: 2014-07-01 | Discharge: 2014-07-03 | Disposition: A | Discharge status: Home / Self Care | Payer: Federal, State, Local not specified - PPO | Attending: Family Medicine | Admitting: Family Medicine

## 2014-07-01 ENCOUNTER — Encounter (HOSPITAL_COMMUNITY): Payer: Self-pay

## 2014-07-01 ENCOUNTER — Emergency Department (HOSPITAL_COMMUNITY): Payer: Federal, State, Local not specified - PPO

## 2014-07-01 DIAGNOSIS — Z79899 Other long term (current) drug therapy: Secondary | ICD-10-CM | POA: Insufficient documentation

## 2014-07-01 DIAGNOSIS — S00511A Abrasion of lip, initial encounter: Secondary | ICD-10-CM | POA: Insufficient documentation

## 2014-07-01 DIAGNOSIS — S0993XA Unspecified injury of face, initial encounter: Secondary | ICD-10-CM | POA: Insufficient documentation

## 2014-07-01 DIAGNOSIS — K649 Unspecified hemorrhoids: Secondary | ICD-10-CM | POA: Diagnosis not present

## 2014-07-01 DIAGNOSIS — R55 Syncope and collapse: Secondary | ICD-10-CM | POA: Diagnosis present

## 2014-07-01 DIAGNOSIS — S0031XA Abrasion of nose, initial encounter: Secondary | ICD-10-CM | POA: Insufficient documentation

## 2014-07-01 DIAGNOSIS — E039 Hypothyroidism, unspecified: Secondary | ICD-10-CM | POA: Diagnosis not present

## 2014-07-01 DIAGNOSIS — K648 Other hemorrhoids: Secondary | ICD-10-CM | POA: Diagnosis present

## 2014-07-01 DIAGNOSIS — D649 Anemia, unspecified: Secondary | ICD-10-CM | POA: Diagnosis not present

## 2014-07-01 DIAGNOSIS — K6289 Other specified diseases of anus and rectum: Secondary | ICD-10-CM | POA: Diagnosis present

## 2014-07-01 DIAGNOSIS — Y9389 Activity, other specified: Secondary | ICD-10-CM | POA: Diagnosis not present

## 2014-07-01 DIAGNOSIS — W01198A Fall on same level from slipping, tripping and stumbling with subsequent striking against other object, initial encounter: Secondary | ICD-10-CM | POA: Insufficient documentation

## 2014-07-01 DIAGNOSIS — Y9289 Other specified places as the place of occurrence of the external cause: Secondary | ICD-10-CM | POA: Insufficient documentation

## 2014-07-01 DIAGNOSIS — F419 Anxiety disorder, unspecified: Secondary | ICD-10-CM | POA: Diagnosis not present

## 2014-07-01 DIAGNOSIS — Y998 Other external cause status: Secondary | ICD-10-CM | POA: Diagnosis not present

## 2014-07-01 DIAGNOSIS — D62 Acute posthemorrhagic anemia: Secondary | ICD-10-CM | POA: Diagnosis present

## 2014-07-01 LAB — URINALYSIS, ROUTINE W REFLEX MICROSCOPIC
BILIRUBIN URINE: NEGATIVE
Glucose, UA: NEGATIVE mg/dL
Ketones, ur: NEGATIVE mg/dL
Nitrite: NEGATIVE
PH: 6 (ref 5.0–8.0)
Protein, ur: NEGATIVE mg/dL
SPECIFIC GRAVITY, URINE: 1.015 (ref 1.005–1.030)
Urobilinogen, UA: 0.2 mg/dL (ref 0.0–1.0)

## 2014-07-01 LAB — BASIC METABOLIC PANEL
Anion gap: 2 — ABNORMAL LOW (ref 5–15)
BUN: 18 mg/dL (ref 6–23)
CO2: 28 mmol/L (ref 19–32)
CREATININE: 0.84 mg/dL (ref 0.50–1.10)
Calcium: 8.5 mg/dL (ref 8.4–10.5)
Chloride: 105 mmol/L (ref 96–112)
GFR, EST AFRICAN AMERICAN: 85 mL/min — AB (ref >90)
GFR, EST NON AFRICAN AMERICAN: 74 mL/min — AB (ref >90)
Glucose, Bld: 164 mg/dL — ABNORMAL HIGH (ref 70–99)
Potassium: 3.7 mmol/L (ref 3.5–5.1)
Sodium: 135 mmol/L (ref 135–145)

## 2014-07-01 LAB — CBC WITH DIFFERENTIAL/PLATELET
BASOS ABS: 0 10*3/uL (ref 0.0–0.1)
Basophils Relative: 0 % (ref 0–1)
EOS ABS: 0 10*3/uL (ref 0.0–0.7)
EOS PCT: 0 % (ref 0–5)
HCT: 31.9 % — ABNORMAL LOW (ref 36.0–46.0)
HEMOGLOBIN: 10.6 g/dL — AB (ref 12.0–15.0)
Lymphocytes Relative: 13 % (ref 12–46)
Lymphs Abs: 1.3 10*3/uL (ref 0.7–4.0)
MCH: 29.2 pg (ref 26.0–34.0)
MCHC: 33.2 g/dL (ref 30.0–36.0)
MCV: 87.9 fL (ref 78.0–100.0)
Monocytes Absolute: 0.5 10*3/uL (ref 0.1–1.0)
Monocytes Relative: 5 % (ref 3–12)
NEUTROS ABS: 8.7 10*3/uL — AB (ref 1.7–7.7)
Neutrophils Relative %: 82 % — ABNORMAL HIGH (ref 43–77)
Platelets: 274 10*3/uL (ref 150–400)
RBC: 3.63 MIL/uL — AB (ref 3.87–5.11)
RDW: 12.5 % (ref 11.5–15.5)
WBC: 10.6 10*3/uL — AB (ref 4.0–10.5)

## 2014-07-01 LAB — TYPE AND SCREEN
ABO/RH(D): O POS
Antibody Screen: NEGATIVE

## 2014-07-01 LAB — URINE MICROSCOPIC-ADD ON

## 2014-07-01 MED ORDER — ACETAMINOPHEN 325 MG PO TABS
650.0000 mg | ORAL_TABLET | Freq: Four times a day (QID) | ORAL | Status: DC | PRN
  Administered 2014-07-02 (×4): 650 mg via ORAL
  Filled 2014-07-01 (×4): qty 2

## 2014-07-01 MED ORDER — DIBUCAINE 1 % RE OINT: 1.0000 "application " | TOPICAL_OINTMENT | Freq: Every day | RECTAL | Status: DC | PRN

## 2014-07-01 MED ORDER — VENLAFAXINE HCL ER 75 MG PO CP24
75.0000 mg | ORAL_CAPSULE | Freq: Every day | ORAL | Status: DC
  Administered 2014-07-02 – 2014-07-03 (×2): 75 mg via ORAL
  Filled 2014-07-01 (×2): qty 1

## 2014-07-01 MED ORDER — SODIUM CHLORIDE 0.9 % IV SOLN
INTRAVENOUS | Status: DC
  Administered 2014-07-02: 08:00:00 via INTRAVENOUS

## 2014-07-01 MED ORDER — LEVOTHYROXINE SODIUM 100 MCG PO TABS
100.0000 ug | ORAL_TABLET | Freq: Every day | ORAL | Status: DC
  Administered 2014-07-02 – 2014-07-03 (×2): 100 ug via ORAL
  Filled 2014-07-01 (×2): qty 1

## 2014-07-01 MED ORDER — SODIUM CHLORIDE 0.9 % IV SOLN
INTRAVENOUS | Status: AC
  Administered 2014-07-01: via INTRAVENOUS

## 2014-07-01 MED ORDER — SODIUM CHLORIDE 0.9 % IV BOLUS (SEPSIS)
1000.0000 mL | Freq: Once | INTRAVENOUS | Status: AC
  Administered 2014-07-01: 1000 mL via INTRAVENOUS

## 2014-07-01 MED ORDER — ACETAMINOPHEN 650 MG RE SUPP: 650.0000 mg | Freq: Four times a day (QID) | RECTAL | Status: DC | PRN

## 2014-07-01 MED ORDER — SODIUM CHLORIDE 0.9 % IJ SOLN
3.0000 mL | Freq: Two times a day (BID) | INTRAMUSCULAR | Status: DC
  Administered 2014-07-02 (×2): 3 mL via INTRAVENOUS

## 2014-07-01 MED ORDER — ONDANSETRON HCL 4 MG/2ML IJ SOLN
4.0000 mg | Freq: Four times a day (QID) | INTRAMUSCULAR | Status: DC | PRN
  Administered 2014-07-02: 4 mg via INTRAVENOUS
  Filled 2014-07-01: qty 2

## 2014-07-01 MED ORDER — ONDANSETRON HCL 4 MG PO TABS
4.0000 mg | ORAL_TABLET | Freq: Four times a day (QID) | ORAL | Status: DC | PRN
  Administered 2014-07-02: 4 mg via ORAL
  Filled 2014-07-01: qty 1

## 2014-07-01 NOTE — ED Notes (Signed)
Pt reports diarrhea which started day, reports getting dizzy and passing out, waking up the vomit in the floor. Pt post hemmoridectomy

## 2014-07-01 NOTE — ED Notes (Signed)
Hannah Beat (POA)  [phone]

## 2014-07-01 NOTE — ED Provider Notes (Signed)
CSN: [id number]     Arrival date & time 07/01/14  1828 History   First MD Initiated Contact with Patient 07/01/14 1844     Chief Complaint  Patient presents with  . Loss of Consciousness     (Consider location/radiation/quality/duration/timing/severity/associated sxs/prior Treatment) HPI.... Patient was sitting on the toilet when she had a syncopal spell, fell forward and struck the floor with her face. She recovered spontaneously. She is status post recent hemorrhoidectomy by Dr. Aviva Signs.  She has had some rectal blood loss recently. Today she had diarrhea and increased bleeding. No chest pain, dyspnea, neurological deficits. Severity is moderate. She is feeling better now. Review systems positive for diarrhea today.  Past Medical History  Diagnosis Date  . Hemorrhoids 2015: [place] x3  . Hypothyroidism AGE 64  . Anxiety   . Anemia    Past Surgical History  Procedure Laterality Date  . Colonoscopy  2007    IH   . Flexible sigmoidoscopy N/A 06/10/2014    SLF: Rectal bleeding/pain due to rectal ulcer caused by hemorrhoid banding. 2. One Hemorrhoid band still in place without evidence of active bleeding  . Stapedectomy Right   . Breast surgery Right   . Hemorrhoid surgery N/A 06/23/2014    Procedure: EXTENSIVE HEMORRHOIDECTOMY;  Surgeon: Jamesetta So, MD;  Location: [facility];  Service: General;  Laterality: N/A;   Family History  Problem Relation Age of Onset  . Colon cancer Neg Hx   . Colon polyps Neg Hx   .      History  Substance Use Topics  . Smoking status: Never Smoker   . Smokeless tobacco: Not on file  . Alcohol Use: Yes     Comment: wine 2-3 times weekly   OB History    No data available     Review of Systems  All other systems reviewed and are negative.     Allergies  Codeine; Doxycycline; and Sulfa antibiotics  Home Medications   Prior to Admission medications   Medication Sig Start Date End Date Taking? Authorizing Provider  Ferrous Sulfate Dried 45  MG TBCR Take 1 tablet by mouth daily.   Yes Historical Provider, MD  ibuprofen (ADVIL,MOTRIN) 200 MG tablet Take 400 mg by mouth every 6 (six) hours as needed for moderate pain.   Yes Historical Provider, MD  KRILL OIL PO Take 2 tablets by mouth daily.    Yes Historical Provider, MD  L-LYSINE HCL PO Take 1 tablet by mouth daily.   Yes Historical Provider, MD  levothyroxine (SYNTHROID, LEVOTHROID) 100 MCG tablet Take 100 mcg by mouth daily before breakfast.  04/27/13  Yes Historical Provider, MD  Lidocaine-Hydrocortisone Ace 3-2.5 % KIT APPLY PR QID FOR 14 DAYS Patient taking differently: Place 1 application rectally daily as needed (for pain).  06/17/14  Yes Danie Binder, MD  Multiple Vitamin (MULTIVITAMIN) capsule Take 1 capsule by mouth daily.   Yes Historical Provider, MD  traMADol (ULTRAM) 50 MG tablet Take 1 tablet (50 mg total) by mouth every 6 (six) hours as needed. 06/23/14  Yes Jamesetta So, MD  venlafaxine XR (EFFEXOR-XR) 75 MG 24 hr capsule Take 75 mg by mouth daily with breakfast.  05/21/13  Yes Historical Provider, MD   There were no vitals taken for this visit. Physical Exam  Constitutional: She is oriented to person, place, and time. She appears well-developed and well-nourished.  HENT:  Head: Normocephalic.  Small abrasion on bridge of nose and right upper lip  Eyes:  Conjunctivae and EOM are normal. Pupils are equal, round, and reactive to light.  Neck: Normal range of motion. Neck supple.  Cardiovascular: Normal rate and regular rhythm.   Pulmonary/Chest: Effort normal and breath sounds normal.  Abdominal: Soft. Bowel sounds are normal.  Genitourinary:  Rectal exam reveals healing pink rectal tissue consistent with a recent hemorrhoidectomy. No gross blood.  Musculoskeletal: Normal range of motion.  Neurological: She is alert and oriented to person, place, and time.  Skin: Skin is warm and dry.  Psychiatric: She has a normal mood and affect. Her behavior is normal.  Nursing  note and vitals reviewed.   ED Course  Procedures (including critical care time) Labs Review Labs Reviewed  CBC WITH DIFFERENTIAL/PLATELET - Abnormal; Notable for the following:    WBC 10.6 (*)    RBC 3.63 (*)    Hemoglobin 10.6 (*)    HCT 31.9 (*)    Neutrophils Relative % 82 (*)    Neutro Abs 8.7 (*)    All other components within normal limits  BASIC METABOLIC PANEL - Abnormal; Notable for the following:    Glucose, Bld 164 (*)    GFR calc non Af Amer 74 (*)    GFR calc Af Amer 85 (*)    Anion gap 2 (*)    All other components within normal limits  URINALYSIS, ROUTINE W REFLEX MICROSCOPIC  TYPE AND SCREEN    Imaging Review Ct Head Wo Contrast  07/01/2014   CLINICAL DATA:  Syncope, passed fell now through today with dizziness, abrasion and swelling to nose and upper lip  EXAM: CT HEAD WITHOUT CONTRAST  CT MAXILLOFACIAL WITHOUT CONTRAST  TECHNIQUE: Multidetector CT imaging of the head and maxillofacial structures were performed using the standard protocol without intravenous contrast. Multiplanar CT image reconstructions of the maxillofacial structures were also generated.  COMPARISON:  11/07/2007  FINDINGS: CT HEAD FINDINGS  Mild diffuse cortical atrophy. Minimal low attenuation in the deep white matter. No evidence of vascular territory infarct. No evidence of mass. No hemorrhage or extra-axial fluid. No hydrocephalus. Calvarium is intact. The visualized portions of the paranasal sinuses demonstrate no significant inflammatory change.  CT MAXILLOFACIAL FINDINGS  There are no facial bone fractures. Nasal bones are normal. Maxilla and mandibles are intact. Orbital roofs and floor normal bilaterally. No significant inflammatory change in the paranasal sinuses. There is small mucous retention cyst noted in the sphenoid sinuses. No air-fluid levels. No significant soft tissue abnormalities over the facial bones.  IMPRESSION: No acute intracranial abnormalities.  No facial bone fractures.    Electronically Signed   By: Skipper Cliche M.D.   On: 07/01/2014 20:03   Ct Maxillofacial Wo Cm  07/01/2014   CLINICAL DATA:  Syncope, passed fell now through today with dizziness, abrasion and swelling to nose and upper lip  EXAM: CT HEAD WITHOUT CONTRAST  CT MAXILLOFACIAL WITHOUT CONTRAST  TECHNIQUE: Multidetector CT imaging of the head and maxillofacial structures were performed using the standard protocol without intravenous contrast. Multiplanar CT image reconstructions of the maxillofacial structures were also generated.  COMPARISON:  11/07/2007  FINDINGS: CT HEAD FINDINGS  Mild diffuse cortical atrophy. Minimal low attenuation in the deep white matter. No evidence of vascular territory infarct. No evidence of mass. No hemorrhage or extra-axial fluid. No hydrocephalus. Calvarium is intact. The visualized portions of the paranasal sinuses demonstrate no significant inflammatory change.  CT MAXILLOFACIAL FINDINGS  There are no facial bone fractures. Nasal bones are normal. Maxilla and mandibles are intact. Orbital  roofs and floor normal bilaterally. No significant inflammatory change in the paranasal sinuses. There is small mucous retention cyst noted in the sphenoid sinuses. No air-fluid levels. No significant soft tissue abnormalities over the facial bones.  IMPRESSION: No acute intracranial abnormalities.  No facial bone fractures.   Electronically Signed   By: Skipper Cliche M.D.   On: 07/01/2014 20:03     EKG Interpretation   Date/Time:  Tuesday July 01 2014 19:44:04 EST Ventricular Rate:  64 PR Interval:  151 QRS Duration: 99 QT Interval:  422 QTC Calculation: 435 R Axis:   4 Text Interpretation:  Sinus rhythm Low voltage, precordial leads  Borderline T abnormalities, anterior leads Confirmed by Markeesha Char  MD, Kenae Lindquist  [phone]) on 07/01/2014 8:00:59 PM      MDM   Final diagnoses:  Facial trauma  Syncope, unspecified syncope type    Patient had syncopal spell today with diarrhea  and some increased rectal blood loss. Her hemoglobin has dropped from 13.2 to 10.6.    CT head and CT maxillofacial show no fractures or subdural hematoma. Patient feels better after IV hydration. Will admit to observation    Nat Christen, MD 07/01/14 2122

## 2014-07-01 NOTE — ED Notes (Signed)
Report given to [facility] on Dept 300, all questions answered.  Deborah patients POA called and update on patients condition and pending transfer.

## 2014-07-01 NOTE — ED Notes (Signed)
Pt denies all pain, cleared off backboard by myself and Ginger Pruitt. Pt remains in [facility]

## 2014-07-01 NOTE — H&P (Addendum)
PCP:   Delphina Cahill, MD   Chief Complaint:  Passed out  HPI: 64 year old female who   has a past medical history of Hemorrhoids (2015: [place] x3); Hypothyroidism (AGE 35); Anxiety; and Anemia. patient has history of internal hemorrhoids, and underwent banding procedure last month, which caused rectal ulcer with pain and bleeding. Patient was then seen by general surgery for thrombosed hemorrhoid, and underwent hemorrhoidectomy on 06/23/2014. Since the procedure patient has been having rectal pain, which has been improving. At this time pain is 2-3/10 in intensity. Today patient came to the ED after she passed out in the bathroom. Patient says that she developed diarrhea and had 10-15 loose bowel movements today, they were bloody. Her hemoglobin hematocrit in the ED is 10.6/31.9 her previous hemoglobin from 06/05/2014 was 11.7/34.4. Patient also had one episode of vomiting today, she denies chest pain, no shortness of breath no fever no dysuria urgency or frequency of urination. Patient also underwent CT head as well as CT maxillofacial which were negative for any acute abnormality.  Allergies:   Allergies  Allergen Reactions  . Codeine Nausea And Vomiting  . Doxycycline Nausea And Vomiting  . Sulfa Antibiotics Rash      Past Medical History  Diagnosis Date  . Hemorrhoids 2015: [place] x3  . Hypothyroidism AGE 89  . Anxiety   . Anemia     Past Surgical History  Procedure Laterality Date  . Colonoscopy  2007    IH   . Flexible sigmoidoscopy N/A 06/10/2014    SLF: Rectal bleeding/pain due to rectal ulcer caused by hemorrhoid banding. 2. One Hemorrhoid band still in place without evidence of active bleeding  . Stapedectomy Right   . Breast surgery Right   . Hemorrhoid surgery N/A 06/23/2014    Procedure: EXTENSIVE HEMORRHOIDECTOMY;  Surgeon: Jamesetta So, MD;  Location: [facility];  Service: General;  Laterality: N/A;    Prior to Admission medications   Medication Sig Start Date End Date  Taking? Authorizing Provider  Ferrous Sulfate Dried 45 MG TBCR Take 1 tablet by mouth daily.   Yes Historical Provider, MD  ibuprofen (ADVIL,MOTRIN) 200 MG tablet Take 400 mg by mouth every 6 (six) hours as needed for moderate pain.   Yes Historical Provider, MD  KRILL OIL PO Take 2 tablets by mouth daily.    Yes Historical Provider, MD  L-LYSINE HCL PO Take 1 tablet by mouth daily.   Yes Historical Provider, MD  levothyroxine (SYNTHROID, LEVOTHROID) 100 MCG tablet Take 100 mcg by mouth daily before breakfast.  04/27/13  Yes Historical Provider, MD  Lidocaine-Hydrocortisone Ace 3-2.5 % KIT APPLY PR QID FOR 14 DAYS Patient taking differently: Place 1 application rectally daily as needed (for pain).  06/17/14  Yes Danie Binder, MD  Multiple Vitamin (MULTIVITAMIN) capsule Take 1 capsule by mouth daily.   Yes Historical Provider, MD  traMADol (ULTRAM) 50 MG tablet Take 1 tablet (50 mg total) by mouth every 6 (six) hours as needed. 06/23/14  Yes Jamesetta So, MD  venlafaxine XR (EFFEXOR-XR) 75 MG 24 hr capsule Take 75 mg by mouth daily with breakfast.  05/21/13  Yes Historical Provider, MD    Social History:  reports that she has never smoked. She does not have any smokeless tobacco history on file. She reports that she drinks alcohol. She reports that she does not use illicit drugs.  Family History  Problem Relation Age of Onset  . Colon cancer Neg Hx   . Colon  polyps Neg Hx   .        All the positives are listed in BOLD  Review of Systems:  HEENT: Headache, blurred vision, runny nose, sore throat Neck: Hypothyroidism, hyperthyroidism,,lymphadenopathy Chest : Shortness of breath, history of COPD, Asthma Heart : Chest pain, history of coronary arterey disease GI:  Nausea, vomiting, diarrhea, constipation, GERD GU: Dysuria, urgency, frequency of urination, hematuria Neuro: Stroke, seizures, syncope Psych: Depression, anxiety, hallucinations   Physical Exam: There were no vitals taken  for this visit. Constitutional:   Patient is a well-developed and well-nourished female in no acute distress and cooperative with exam. Head: Normocephalic and atraumatic Mouth: Swollen upper lip with small laceration at the upper lip, no active bleed Eyes: PERRL, EOMI, conjunctivae normal Neck: Supple, No Thyromegaly Cardiovascular: RRR, S1 normal, S2 normal Pulmonary/Chest: CTAB, no wheezes, rales, or rhonchi Abdominal: Soft. Non-tender, non-distended, bowel sounds are normal, no masses, organomegaly, or guarding present.  Neurological: A&O x3, Strength is normal and symmetric bilaterally, cranial nerve II-XII are grossly intact, no focal motor deficit, sensory intact to light touch bilaterally.  Extremities : No Cyanosis, Clubbing or Edema  Labs on Admission:  Basic Metabolic Panel:  Recent Labs Lab 07/01/14 1959  NA 135  K 3.7  CL 105  CO2 28  GLUCOSE 164*  BUN 18  CREATININE 0.84  CALCIUM 8.5    CBC:  Recent Labs Lab 07/01/14 1959  WBC 10.6*  NEUTROABS 8.7*  HGB 10.6*  HCT 31.9*  MCV 87.9  PLT 274    Radiological Exams on Admission: Ct Head Wo Contrast  07/01/2014   CLINICAL DATA:  Syncope, passed fell now through today with dizziness, abrasion and swelling to nose and upper lip  EXAM: CT HEAD WITHOUT CONTRAST  CT MAXILLOFACIAL WITHOUT CONTRAST  TECHNIQUE: Multidetector CT imaging of the head and maxillofacial structures were performed using the standard protocol without intravenous contrast. Multiplanar CT image reconstructions of the maxillofacial structures were also generated.  COMPARISON:  11/07/2007  FINDINGS: CT HEAD FINDINGS  Mild diffuse cortical atrophy. Minimal low attenuation in the deep white matter. No evidence of vascular territory infarct. No evidence of mass. No hemorrhage or extra-axial fluid. No hydrocephalus. Calvarium is intact. The visualized portions of the paranasal sinuses demonstrate no significant inflammatory change.  CT MAXILLOFACIAL  FINDINGS  There are no facial bone fractures. Nasal bones are normal. Maxilla and mandibles are intact. Orbital roofs and floor normal bilaterally. No significant inflammatory change in the paranasal sinuses. There is small mucous retention cyst noted in the sphenoid sinuses. No air-fluid levels. No significant soft tissue abnormalities over the facial bones.  IMPRESSION: No acute intracranial abnormalities.  No facial bone fractures.   Electronically Signed   By: Skipper Cliche M.D.   On: 07/01/2014 20:03   Ct Maxillofacial Wo Cm  07/01/2014   CLINICAL DATA:  Syncope, passed fell now through today with dizziness, abrasion and swelling to nose and upper lip  EXAM: CT HEAD WITHOUT CONTRAST  CT MAXILLOFACIAL WITHOUT CONTRAST  TECHNIQUE: Multidetector CT imaging of the head and maxillofacial structures were performed using the standard protocol without intravenous contrast. Multiplanar CT image reconstructions of the maxillofacial structures were also generated.  COMPARISON:  11/07/2007  FINDINGS: CT HEAD FINDINGS  Mild diffuse cortical atrophy. Minimal low attenuation in the deep white matter. No evidence of vascular territory infarct. No evidence of mass. No hemorrhage or extra-axial fluid. No hydrocephalus. Calvarium is intact. The visualized portions of the paranasal sinuses demonstrate no significant inflammatory  change.  CT MAXILLOFACIAL FINDINGS  There are no facial bone fractures. Nasal bones are normal. Maxilla and mandibles are intact. Orbital roofs and floor normal bilaterally. No significant inflammatory change in the paranasal sinuses. There is small mucous retention cyst noted in the sphenoid sinuses. No air-fluid levels. No significant soft tissue abnormalities over the facial bones.  IMPRESSION: No acute intracranial abnormalities.  No facial bone fractures.   Electronically Signed   By: Skipper Cliche M.D.   On: 07/01/2014 20:03    EKG: Independently reviewed. EKG shows normal sinus rhythm,  T-wave inversions in the anterior as well as T-wave flattening in the inferior leads.   Assessment/Plan Active Problems:   Internal hemorrhoids with complication   Rectal pain   Syncope  Syncope Likely from dehydration and diarrhea, we'll admit the patient for IV fluid and hydration. Due to EKG changes I will also obtain 2-D echo cardiac exam in the morning and cycle the cardiac enzymes.  Rectal pain Patient does not want any opiates, will only give Tylenol when necessary. Rectal exam was performed by Dr Lacinda Axon, who says it showed normal post op changes.  Diarrhea ? Cause, will check stool for C. difficile PCR. Continue IV fluids.  Anemia Patient's hemoglobin is 10.6/31.9, will follow CBC in a.m.  Hypothyroidism Continue Synthroid, will also check TSH.  DVT prophylaxis SCDs  Code status: Full code  Family discussion: No family at bedside.   Time Spent on Admission: 60 minutes  [facility] Pager: [phone] 07/01/2014, 9:38 PM  If 7PM-7AM, please contact night-coverage  [email or web address]  Password [email or web address]

## 2014-07-02 DIAGNOSIS — D62 Acute posthemorrhagic anemia: Secondary | ICD-10-CM | POA: Diagnosis present

## 2014-07-02 DIAGNOSIS — R55 Syncope and collapse: Secondary | ICD-10-CM

## 2014-07-02 DIAGNOSIS — E039 Hypothyroidism, unspecified: Secondary | ICD-10-CM | POA: Diagnosis present

## 2014-07-02 DIAGNOSIS — F419 Anxiety disorder, unspecified: Secondary | ICD-10-CM | POA: Diagnosis present

## 2014-07-02 LAB — COMPREHENSIVE METABOLIC PANEL
ALT: 13 U/L (ref 0–35)
AST: 17 U/L (ref 0–37)
Albumin: 2.8 g/dL — ABNORMAL LOW (ref 3.5–5.2)
Alkaline Phosphatase: 44 U/L (ref 39–117)
Anion gap: 3 — ABNORMAL LOW (ref 5–15)
BUN: 11 mg/dL (ref 6–23)
CALCIUM: 8.6 mg/dL (ref 8.4–10.5)
CO2: 28 mmol/L (ref 19–32)
Chloride: 113 mmol/L — ABNORMAL HIGH (ref 96–112)
Creatinine, Ser: 0.78 mg/dL (ref 0.50–1.10)
GFR calc non Af Amer: 88 mL/min — ABNORMAL LOW (ref >90)
Glucose, Bld: 100 mg/dL — ABNORMAL HIGH (ref 70–99)
Potassium: 4.2 mmol/L (ref 3.5–5.1)
SODIUM: 144 mmol/L (ref 135–145)
Total Bilirubin: 0.4 mg/dL (ref 0.3–1.2)
Total Protein: 5.1 g/dL — ABNORMAL LOW (ref 6.0–8.3)

## 2014-07-02 LAB — CBC
HCT: 26.4 % — ABNORMAL LOW (ref 36.0–46.0)
HEMATOCRIT: 27 % — AB (ref 36.0–46.0)
Hemoglobin: 9 g/dL — ABNORMAL LOW (ref 12.0–15.0)
Hemoglobin: 9.2 g/dL — ABNORMAL LOW (ref 12.0–15.0)
MCH: 29.9 pg (ref 26.0–34.0)
MCH: 29.9 pg (ref 26.0–34.0)
MCHC: 34.1 g/dL (ref 30.0–36.0)
MCHC: 34.1 g/dL (ref 30.0–36.0)
MCV: 87.7 fL (ref 78.0–100.0)
MCV: 87.7 fL (ref 78.0–100.0)
Platelets: 229 10*3/uL (ref 150–400)
Platelets: 226 10*3/uL (ref 150–400)
RBC: 3.01 MIL/uL — ABNORMAL LOW (ref 3.87–5.11)
RBC: 3.08 MIL/uL — ABNORMAL LOW (ref 3.87–5.11)
RDW: 12.7 % (ref 11.5–15.5)
RDW: 12.8 % (ref 11.5–15.5)
WBC: 5.8 10*3/uL (ref 4.0–10.5)
WBC: 5.7 10*3/uL (ref 4.0–10.5)

## 2014-07-02 LAB — TROPONIN I
Troponin I: <0.03 ng/mL (ref <0.031)
Troponin I: <0.03 ng/mL (ref <0.031)

## 2014-07-02 LAB — CLOSTRIDIUM DIFFICILE BY PCR: Toxigenic C. Difficile by PCR: NEGATIVE

## 2014-07-02 LAB — TSH: TSH: 1.558 u[IU]/mL (ref 0.350–4.500)

## 2014-07-02 NOTE — Progress Notes (Signed)
UR chart review completed.  

## 2014-07-02 NOTE — Clinical Documentation Improvement (Signed)
Possible Clinical Conditions?    Acute Blood Loss Anemia  Acute on chronic blood loss anemia  Chronic blood loss anemia  Precipitous drop in Hematocrit  Other Condition  Cannot Clinically Determine    Supporting Information: (As per ED notes) "Patient had syncopal spell today with diarrhea and some increased rectal blood loss. Her hemoglobin has dropped from 13.2 to 10.6."  Risk Factors: (recent surgery) "She is status post recent hemorrhoidectomy by Dr. Aviva Signs"  Thank You, Alessandra Grout, RN, BSN, CCDS,Clinical Documentation Specialist:  [phone]  [phone]=[facility]

## 2014-07-02 NOTE — Progress Notes (Signed)
[facility] PROGRESS NOTE  Stacey Tucker ENI:[id number] DOB: 08-21-1950 DOA: 07/01/2014 PCP: Delphina Cahill, MD  Assessment/Plan: Syncope Likely from dehydration and diarrhea in setting of acute blood loss anemia s/p hemorrhoidectomy. No events on tele.  For completeness  2-D echo requested. Troponin negative x3. Will obtain orthostatic VS. Continue IV fluids at slower rate  Acute blood lossAnemia  frequent bloody stools prior to admission and 2 episodes since. Likely related to recent hemorrhoidectomy. Hemoglobin is 9.0 this am down from 10.6 yesterday. Will obtain serial CBG and request general surgery consult.   Rectal pain/bleeding Related to recent (2/8) hemorrhoidectomy and s/p hemorrhoid banding 4 weeks prior to that with complications. Developed ulceration and thrombosed hemorrhoid.  Pain meds as indicated. General surgery consult. Will keep on clears until evaluated by general surgery.  Diarrhea Reports loose stool mostly blood. c diff negative. Continue IV fluids as noted above.  Hypothyroidism Continue Synthroid, await TSH.  Anxiety Appears stable at baseline. Continue home med  Code Status: full Family Communication: none present Disposition Plan: home hopefully tomorrow   Consultants:  General surgery  Procedures:  none  Antibiotics:  none  HPI/Subjective: Sitting up in bed. Reports continue rectal pain and bleeding  Objective: Filed Vitals:   07/02/14 0517  BP: 121/69  Pulse: 76  Temp: 98.1 F (36.7 C)  Resp: 20    Intake/Output Summary (Last 24 hours) at 07/02/14 1023 Last data filed at 07/02/14 0825  Gross per 24 hour  Intake      0 ml  Output   1300 ml  Net  -1300 ml   Filed Weights   07/01/14 2258  Weight: 56.7 kg (125 lb)    Exam:   General:  Well nourished somewhat irritable NSD  Cardiovascular: RRR no MGR no LE edema  Respiratory: normal effort BS clear bilaterally no wheeze  Abdomen: non-distended non-tender  +BS  Musculoskeletal: no clubbing or cyanosis  Skin: abrasion/swelling of upper lip. Abrasion on bridge of nose   Data Reviewed: Basic Metabolic Panel:  Recent Labs Lab 07/01/14 1959 07/02/14 0549  NA 135 144  K 3.7 4.2  CL 105 113*  CO2 28 28  GLUCOSE 164* 100*  BUN 18 11  CREATININE 0.84 0.78  CALCIUM 8.5 8.6   Liver Function Tests:  Recent Labs Lab 07/02/14 0549  AST 17  ALT 13  ALKPHOS 44  BILITOT 0.4  PROT 5.1*  ALBUMIN 2.8*   No results for input(s): LIPASE, AMYLASE in the last 168 hours. No results for input(s): AMMONIA in the last 168 hours. CBC:  Recent Labs Lab 07/01/14 1959 07/02/14 0549  WBC 10.6* 5.8  NEUTROABS 8.7*  --   HGB 10.6* 9.0*  HCT 31.9* 26.4*  MCV 87.9 87.7  PLT 274 229   Cardiac Enzymes:  Recent Labs Lab 07/01/14 1959 07/02/14 0549  TROPONINI <0.03 <0.03   BNP (last 3 results) No results for input(s): BNP in the last 8760 hours.  ProBNP (last 3 results) No results for input(s): PROBNP in the last 8760 hours.  CBG: No results for input(s): GLUCAP in the last 168 hours.  Recent Results (from the past 240 hour(s))  Clostridium Difficile by PCR     Status: None   Collection Time: 07/02/14 12:24 AM  Result Value Ref Range Status   C difficile by pcr NEGATIVE NEGATIVE Final     Studies: Ct Head Wo Contrast  07/01/2014   CLINICAL DATA:  Syncope, passed fell now through today with dizziness, abrasion and swelling  to nose and upper lip  EXAM: CT HEAD WITHOUT CONTRAST  CT MAXILLOFACIAL WITHOUT CONTRAST  TECHNIQUE: Multidetector CT imaging of the head and maxillofacial structures were performed using the standard protocol without intravenous contrast. Multiplanar CT image reconstructions of the maxillofacial structures were also generated.  COMPARISON:  11/07/2007  FINDINGS: CT HEAD FINDINGS  Mild diffuse cortical atrophy. Minimal low attenuation in the deep white matter. No evidence of vascular territory infarct. No evidence of  mass. No hemorrhage or extra-axial fluid. No hydrocephalus. Calvarium is intact. The visualized portions of the paranasal sinuses demonstrate no significant inflammatory change.  CT MAXILLOFACIAL FINDINGS  There are no facial bone fractures. Nasal bones are normal. Maxilla and mandibles are intact. Orbital roofs and floor normal bilaterally. No significant inflammatory change in the paranasal sinuses. There is small mucous retention cyst noted in the sphenoid sinuses. No air-fluid levels. No significant soft tissue abnormalities over the facial bones.  IMPRESSION: No acute intracranial abnormalities.  No facial bone fractures.   Electronically Signed   By: Skipper Cliche M.D.   On: 07/01/2014 20:03   Ct Maxillofacial Wo Cm  07/01/2014   CLINICAL DATA:  Syncope, passed fell now through today with dizziness, abrasion and swelling to nose and upper lip  EXAM: CT HEAD WITHOUT CONTRAST  CT MAXILLOFACIAL WITHOUT CONTRAST  TECHNIQUE: Multidetector CT imaging of the head and maxillofacial structures were performed using the standard protocol without intravenous contrast. Multiplanar CT image reconstructions of the maxillofacial structures were also generated.  COMPARISON:  11/07/2007  FINDINGS: CT HEAD FINDINGS  Mild diffuse cortical atrophy. Minimal low attenuation in the deep white matter. No evidence of vascular territory infarct. No evidence of mass. No hemorrhage or extra-axial fluid. No hydrocephalus. Calvarium is intact. The visualized portions of the paranasal sinuses demonstrate no significant inflammatory change.  CT MAXILLOFACIAL FINDINGS  There are no facial bone fractures. Nasal bones are normal. Maxilla and mandibles are intact. Orbital roofs and floor normal bilaterally. No significant inflammatory change in the paranasal sinuses. There is small mucous retention cyst noted in the sphenoid sinuses. No air-fluid levels. No significant soft tissue abnormalities over the facial bones.  IMPRESSION: No acute  intracranial abnormalities.  No facial bone fractures.   Electronically Signed   By: Skipper Cliche M.D.   On: 07/01/2014 20:03    Scheduled Meds: . sodium chloride   Intravenous STAT  . levothyroxine  100 mcg Oral QAC breakfast  . sodium chloride  3 mL Intravenous Q12H  . venlafaxine XR  75 mg Oral Q breakfast   Continuous Infusions: . sodium chloride 100 mL/hr at 07/02/14 0825    Principal Problem:   Syncope Active Problems:   Internal hemorrhoids with complication   Rectal pain   Acute blood loss anemia   Hypothyroidism   Anxiety    Time spent: 40 minutes    [facility] Pager [phone]. If 7PM-7AM, please contact night-coverage at [email or web address], password [facility] 07/02/2014, 10:23 AM  LOS: 1 day

## 2014-07-02 NOTE — Consult Note (Signed)
Reason for Consult:s/p hemorrhoidectomy and syncopal episode Referring Physician: Dr. Unice Bailey is an 64 y.o. female.  HPI: Pt is a 64 y/o F s/p hemorrhoidectomy by Dr. Arnoldo Morale on 2/8.  Pt states she had some diarrhea yesterday and some blood in her BMs.  She became syncopal on the toliet and had some emesis.  Pt states she has not had any more blood per rectum.   Past Medical History  Diagnosis Date  . Hemorrhoids 2015: [place] x3  . Hypothyroidism AGE 40  . Anxiety   . Anemia     Past Surgical History  Procedure Laterality Date  . Colonoscopy  2007    [facility]   . Flexible sigmoidoscopy N/A 06/10/2014    SLF: Rectal bleeding/pain due to rectal ulcer caused by hemorrhoid banding. 2. One Hemorrhoid band still in place without evidence of active bleeding  . Stapedectomy Right   . Breast surgery Right   . Hemorrhoid surgery N/A 06/23/2014    Procedure: EXTENSIVE HEMORRHOIDECTOMY;  Surgeon: Jamesetta So, MD;  Location: [facility];  Service: General;  Laterality: N/A;    Family History  Problem Relation Age of Onset  . Colon cancer Neg Hx   . Colon polyps Neg Hx   .       Social History:  reports that she has never smoked. She does not have any smokeless tobacco history on file. She reports that she drinks alcohol. She reports that she does not use illicit drugs.  Allergies:  Allergies  Allergen Reactions  . Codeine Nausea And Vomiting  . Doxycycline Nausea And Vomiting  . Sulfa Antibiotics Rash    Medications: I have reviewed the patient's current medications.  Results for orders placed or performed during the hospital encounter of 07/01/14 (from the past 48 hour(s))  CBC with Differential/Platelet     Status: Abnormal   Collection Time: 07/01/14  7:59 PM  Result Value Ref Range   WBC 10.6 (H) 4.0 - 10.5 K/uL   RBC 3.63 (L) 3.87 - 5.11 MIL/uL   Hemoglobin 10.6 (L) 12.0 - 15.0 g/dL   HCT 31.9 (L) 36.0 - 46.0 %   MCV 87.9 78.0 - 100.0 fL   MCH 29.2 26.0 - 34.0 pg    MCHC 33.2 30.0 - 36.0 g/dL   RDW 12.5 11.5 - 15.5 %   Platelets 274 150 - 400 K/uL   Neutrophils Relative % 82 (H) 43 - 77 %   Neutro Abs 8.7 (H) 1.7 - 7.7 K/uL   Lymphocytes Relative 13 12 - 46 %   Lymphs Abs 1.3 0.7 - 4.0 K/uL   Monocytes Relative 5 3 - 12 %   Monocytes Absolute 0.5 0.1 - 1.0 K/uL   Eosinophils Relative 0 0 - 5 %   Eosinophils Absolute 0.0 0.0 - 0.7 K/uL   Basophils Relative 0 0 - 1 %   Basophils Absolute 0.0 0.0 - 0.1 K/uL  Basic metabolic panel     Status: Abnormal   Collection Time: 07/01/14  7:59 PM  Result Value Ref Range   Sodium 135 135 - 145 mmol/L   Potassium 3.7 3.5 - 5.1 mmol/L   Chloride 105 96 - 112 mmol/L   CO2 28 19 - 32 mmol/L   Glucose, Bld 164 (H) 70 - 99 mg/dL   BUN 18 6 - 23 mg/dL   Creatinine, Ser 0.84 0.50 - 1.10 mg/dL   Calcium 8.5 8.4 - 10.5 mg/dL   GFR calc non Af Amer 74 (  L) >90 mL/min   GFR calc Af Amer 85 (L) >90 mL/min    Comment: (NOTE) The eGFR has been calculated using the CKD EPI equation. This calculation has not been validated in all clinical situations. eGFR's persistently <90 mL/min signify possible Chronic Kidney Disease.    Anion gap 2 (L) 5 - 15  Type and screen for Red Blood Exchange     Status: None   Collection Time: 07/01/14  7:59 PM  Result Value Ref Range   ABO/RH(D) O POS    Antibody Screen NEG    Sample Expiration 07/04/2014   Troponin I (q 6hr x 3)     Status: None   Collection Time: 07/01/14  7:59 PM  Result Value Ref Range   Troponin I <0.03 <0.031 ng/mL    Comment:        NO INDICATION OF MYOCARDIAL INJURY.   Urinalysis, Routine w reflex microscopic     Status: Abnormal   Collection Time: 07/01/14  9:06 PM  Result Value Ref Range   Color, Urine YELLOW YELLOW   APPearance CLEAR CLEAR   Specific Gravity, Urine 1.015 1.005 - 1.030   pH 6.0 5.0 - 8.0   Glucose, UA NEGATIVE NEGATIVE mg/dL   Hgb urine dipstick SMALL (A) NEGATIVE   Bilirubin Urine NEGATIVE NEGATIVE   Ketones, ur NEGATIVE  NEGATIVE mg/dL   Protein, ur NEGATIVE NEGATIVE mg/dL   Urobilinogen, UA 0.2 0.0 - 1.0 mg/dL   Nitrite NEGATIVE NEGATIVE   Leukocytes, UA TRACE (A) NEGATIVE  Urine microscopic-add on     Status: Abnormal   Collection Time: 07/01/14  9:06 PM  Result Value Ref Range   Squamous Epithelial / LPF FEW (A) RARE   WBC, UA 0-2 <3 WBC/hpf   RBC / HPF 0-2 <3 RBC/hpf   Bacteria, UA RARE RARE  Clostridium Difficile by PCR     Status: None   Collection Time: 07/02/14 12:24 AM  Result Value Ref Range   C difficile by pcr NEGATIVE NEGATIVE  CBC     Status: Abnormal   Collection Time: 07/02/14  5:49 AM  Result Value Ref Range   WBC 5.8 4.0 - 10.5 K/uL   RBC 3.01 (L) 3.87 - 5.11 MIL/uL   Hemoglobin 9.0 (L) 12.0 - 15.0 g/dL   HCT 26.4 (L) 36.0 - 46.0 %   MCV 87.7 78.0 - 100.0 fL   MCH 29.9 26.0 - 34.0 pg   MCHC 34.1 30.0 - 36.0 g/dL   RDW 12.7 11.5 - 15.5 %   Platelets 229 150 - 400 K/uL  Comprehensive metabolic panel     Status: Abnormal   Collection Time: 07/02/14  5:49 AM  Result Value Ref Range   Sodium 144 135 - 145 mmol/L    Comment: DELTA CHECK NOTED   Potassium 4.2 3.5 - 5.1 mmol/L   Chloride 113 (H) 96 - 112 mmol/L   CO2 28 19 - 32 mmol/L   Glucose, Bld 100 (H) 70 - 99 mg/dL   BUN 11 6 - 23 mg/dL   Creatinine, Ser 0.78 0.50 - 1.10 mg/dL   Calcium 8.6 8.4 - 10.5 mg/dL   Total Protein 5.1 (L) 6.0 - 8.3 g/dL   Albumin 2.8 (L) 3.5 - 5.2 g/dL   AST 17 0 - 37 U/L   ALT 13 0 - 35 U/L   Alkaline Phosphatase 44 39 - 117 U/L   Total Bilirubin 0.4 0.3 - 1.2 mg/dL   GFR calc non Af Amer 88 (  L) >90 mL/min   GFR calc Af Amer >90 >90 mL/min    Comment: (NOTE) The eGFR has been calculated using the CKD EPI equation. This calculation has not been validated in all clinical situations. eGFR's persistently <90 mL/min signify possible Chronic Kidney Disease.    Anion gap 3 (L) 5 - 15  Troponin I (q 6hr x 3)     Status: None   Collection Time: 07/02/14  5:49 AM  Result Value Ref Range    Troponin I <0.03 <0.031 ng/mL    Comment:        NO INDICATION OF MYOCARDIAL INJURY.   TSH     Status: None   Collection Time: 07/02/14 10:55 AM  Result Value Ref Range   TSH 1.558 0.350 - 4.500 uIU/mL  Troponin I (q 6hr x 3)     Status: None   Collection Time: 07/02/14 10:56 AM  Result Value Ref Range   Troponin I <0.03 <0.031 ng/mL    Comment:        NO INDICATION OF MYOCARDIAL INJURY.   CBC     Status: Abnormal   Collection Time: 07/02/14  1:46 PM  Result Value Ref Range   WBC 5.7 4.0 - 10.5 K/uL   RBC 3.08 (L) 3.87 - 5.11 MIL/uL   Hemoglobin 9.2 (L) 12.0 - 15.0 g/dL   HCT 27.0 (L) 36.0 - 46.0 %   MCV 87.7 78.0 - 100.0 fL   MCH 29.9 26.0 - 34.0 pg   MCHC 34.1 30.0 - 36.0 g/dL   RDW 12.8 11.5 - 15.5 %   Platelets 226 150 - 400 K/uL    Ct Head Wo Contrast  07/01/2014   CLINICAL DATA:  Syncope, passed fell now through today with dizziness, abrasion and swelling to nose and upper lip  EXAM: CT HEAD WITHOUT CONTRAST  CT MAXILLOFACIAL WITHOUT CONTRAST  TECHNIQUE: Multidetector CT imaging of the head and maxillofacial structures were performed using the standard protocol without intravenous contrast. Multiplanar CT image reconstructions of the maxillofacial structures were also generated.  COMPARISON:  11/07/2007  FINDINGS: CT HEAD FINDINGS  Mild diffuse cortical atrophy. Minimal low attenuation in the deep white matter. No evidence of vascular territory infarct. No evidence of mass. No hemorrhage or extra-axial fluid. No hydrocephalus. Calvarium is intact. The visualized portions of the paranasal sinuses demonstrate no significant inflammatory change.  CT MAXILLOFACIAL FINDINGS  There are no facial bone fractures. Nasal bones are normal. Maxilla and mandibles are intact. Orbital roofs and floor normal bilaterally. No significant inflammatory change in the paranasal sinuses. There is small mucous retention cyst noted in the sphenoid sinuses. No air-fluid levels. No significant soft tissue  abnormalities over the facial bones.  IMPRESSION: No acute intracranial abnormalities.  No facial bone fractures.   Electronically Signed   By: Skipper Cliche M.D.   On: 07/01/2014 20:03   Ct Maxillofacial Wo Cm  07/01/2014   CLINICAL DATA:  Syncope, passed fell now through today with dizziness, abrasion and swelling to nose and upper lip  EXAM: CT HEAD WITHOUT CONTRAST  CT MAXILLOFACIAL WITHOUT CONTRAST  TECHNIQUE: Multidetector CT imaging of the head and maxillofacial structures were performed using the standard protocol without intravenous contrast. Multiplanar CT image reconstructions of the maxillofacial structures were also generated.  COMPARISON:  11/07/2007  FINDINGS: CT HEAD FINDINGS  Mild diffuse cortical atrophy. Minimal low attenuation in the deep white matter. No evidence of vascular territory infarct. No evidence of mass. No hemorrhage or extra-axial fluid. No  hydrocephalus. Calvarium is intact. The visualized portions of the paranasal sinuses demonstrate no significant inflammatory change.  CT MAXILLOFACIAL FINDINGS  There are no facial bone fractures. Nasal bones are normal. Maxilla and mandibles are intact. Orbital roofs and floor normal bilaterally. No significant inflammatory change in the paranasal sinuses. There is small mucous retention cyst noted in the sphenoid sinuses. No air-fluid levels. No significant soft tissue abnormalities over the facial bones.  IMPRESSION: No acute intracranial abnormalities.  No facial bone fractures.   Electronically Signed   By: Skipper Cliche M.D.   On: 07/01/2014 20:03    Review of Systems  Constitutional: Negative for weight loss.  HENT: Negative for ear discharge, ear pain, hearing loss and tinnitus.   Eyes: Negative for blurred vision, double vision, photophobia and pain.  Respiratory: Negative for cough, sputum production and shortness of breath.   Cardiovascular: Negative for chest pain.  Gastrointestinal: Negative for nausea, vomiting and  abdominal pain.  Genitourinary: Negative for dysuria, urgency, frequency and flank pain.  Musculoskeletal: Negative for myalgias, back pain, joint pain, falls and neck pain.  Neurological: Negative for dizziness, tingling, sensory change, focal weakness, loss of consciousness and headaches.  Endo/Heme/Allergies: Does not bruise/bleed easily.  Psychiatric/Behavioral: Negative for depression, memory loss and substance abuse. The patient is not nervous/anxious.    Blood pressure 112/69, pulse 82, temperature 98.5 F (36.9 C), temperature source Oral, resp. rate 20, height $RemoveBe'5\' 4"'[id number]$  (1.626 m), weight 125 lb (56.7 kg), SpO2 100 %. Physical Exam  Vitals reviewed. Constitutional: She is oriented to person, place, and time. She appears well-developed and well-nourished. She is cooperative. No distress. Cervical collar and nasal cannula in place.  HENT:  Head: Normocephalic and atraumatic. Head is without raccoon's eyes, without Battle's sign, without abrasion, without contusion and without laceration.  Right Ear: Hearing, tympanic membrane, external ear and ear canal normal. No lacerations. No drainage or tenderness. No foreign bodies. Tympanic membrane is not perforated. No hemotympanum.  Left Ear: Hearing, tympanic membrane, external ear and ear canal normal. No lacerations. No drainage or tenderness. No foreign bodies. Tympanic membrane is not perforated. No hemotympanum.  Nose: Nose normal. No nose lacerations, sinus tenderness, nasal deformity or nasal septal hematoma. No epistaxis.  Mouth/Throat: Uvula is midline, oropharynx is clear and moist and mucous membranes are normal. No lacerations.  Eyes: Conjunctivae, EOM and lids are normal. Pupils are equal, round, and reactive to light. No scleral icterus.  Neck: Trachea normal. No JVD present. No spinous process tenderness and no muscular tenderness present. Carotid bruit is not present. No thyromegaly present.  Cardiovascular: Normal rate, regular  rhythm, normal heart sounds, intact distal pulses and normal pulses.   Respiratory: Effort normal and breath sounds normal. No respiratory distress. She exhibits no tenderness, no bony tenderness, no laceration and no crepitus.  GI: Soft. Normal appearance. She exhibits no distension. Bowel sounds are decreased. There is no tenderness. There is no rigidity, no rebound, no guarding and no CVA tenderness.  Genitourinary: Rectum normal.  Musculoskeletal: Normal range of motion. She exhibits no edema or tenderness.  Lymphadenopathy:    She has no cervical adenopathy.  Neurological: She is alert and oriented to person, place, and time. She has normal strength. No cranial nerve deficit or sensory deficit. GCS eye subscore is 4. GCS verbal subscore is 5. GCS motor subscore is 6.  Skin: Skin is warm, dry and intact. She is not diaphoretic.  Psychiatric: She has a normal mood and affect. Her speech is normal and  behavior is normal.    Assessment/Plan: 64 y/o F s/p hemorrhoidectomy with syncope I d/w the pt that it is not unusal to have some blood post op with this surgery.  I don't think her hct is low enough to have no exertional syncope.  Could be due to dehydration.  F/u with Dr. Arnoldo Morale as scheduled.  Rosario Jacks., Tarron Krolak 07/02/2014, 3:51 PM

## 2014-07-02 NOTE — Care Management Note (Addendum)
    Page 1 of 1   07/03/2014     12:50:03 PM CARE MANAGEMENT NOTE 07/03/2014  Patient:  SAMARI, BITTINGER   Account Number:  [id number]  Date Initiated:  07/02/2014  Documentation initiated by:  Theophilus Kinds  Subjective/Objective Assessment:   Pt admitted from home with syncope and dehydration. Pt lives with family and will return home at discharge. Pt is independent with ADL's.     Action/Plan:   No Cm needs noted.   Anticipated DC Date:  07/03/2014   Anticipated DC Plan:  [place]  CM consult      Choice offered to / List presented to:             Status of service:  Completed, signed off Medicare Important Message given?   (If response is "NO", the following Medicare IM given date fields will be blank) Date Medicare IM given:   Medicare IM given by:   Date Additional Medicare IM given:   Additional Medicare IM given by:    Discharge Disposition:  HOME/SELF CARE  Per UR Regulation:    If discussed at Long Length of Stay Meetings, dates discussed:    Comments:  07/03/14 [place], RN BSN CM Pt to be discharged home today. No CM needs noted.  07/02/14 [place], RN BSN CM

## 2014-07-02 NOTE — Progress Notes (Signed)
  Echocardiogram 2D Echocardiogram has been performed.  Stacey Tucker 07/02/2014, 4:00 PM

## 2014-07-02 NOTE — Progress Notes (Signed)
Patient ambulated hallways with supervision greater that 200 feet. Tolerated well with no dizziness.

## 2014-07-03 DIAGNOSIS — R55 Syncope and collapse: Secondary | ICD-10-CM | POA: Insufficient documentation

## 2014-07-03 LAB — CBC
HEMATOCRIT: 28.8 % — AB (ref 36.0–46.0)
Hemoglobin: 9.6 g/dL — ABNORMAL LOW (ref 12.0–15.0)
MCH: 29.4 pg (ref 26.0–34.0)
MCHC: 33.3 g/dL (ref 30.0–36.0)
MCV: 88.1 fL (ref 78.0–100.0)
Platelets: 246 10*3/uL (ref 150–400)
RBC: 3.27 MIL/uL — AB (ref 3.87–5.11)
RDW: 12.8 % (ref 11.5–15.5)
WBC: 6.1 10*3/uL (ref 4.0–10.5)

## 2014-07-03 MED ORDER — DIBUCAINE 1 % RE OINT: 1.0000 "application " | TOPICAL_OINTMENT | Freq: Every day | RECTAL | Status: DC | PRN

## 2014-07-03 NOTE — Progress Notes (Signed)
Discharge instructions given on medications,and follow up visits,patient verbalized understanding. No c/o pain or discomfort noted. Vital signs stable.

## 2014-07-03 NOTE — Discharge Summary (Signed)
Physician Discharge Summary  Stacey Tucker ZJI:[id number] DOB: 1951-03-05 DOA: 07/01/2014  PCP: Delphina Cahill, MD  Admit date: 07/01/2014 Discharge date: 07/03/2014  Time spent: 40 minutes  Recommendations for Outpatient Follow-up:  1. Follow up with Dr Arnoldo Morale as post op visit to hemorrhoidectomy 1 week  2. Follow up with Dr Nevada Crane 1 week recommend CBC to track Hg  Discharge Diagnoses:  Principal Problem:   Syncope Active Problems:   Internal hemorrhoids with complication   Rectal pain   Acute blood loss anemia   Hypothyroidism   Anxiety   Discharge Condition: stable  Diet recommendation: regular   Filed Weights   07/01/14 2258  Weight: 56.7 kg (125 lb)    History of present illness:  64 year old female whowith past medical history of Hemorrhoids (2015: [place] x3); Hypothyroidism (AGE 36); Anxiety; and Anemia.   Patient has history of internal hemorrhoids, and underwent banding procedure one month prior to presentation on 07/01/14, which caused rectal ulcer with pain and bleeding. Patient was then seen by general surgery for thrombosed hemorrhoid, and underwent hemorrhoidectomy on 06/23/2014. Since that procedure patient had been having rectal pain.Marland Kitchen At presentation time pain is 2-3/10 in intensity. Patient came to the ED after she passed out in the bathroom. Patient said that she developed diarrhea and had 10-15 loose bowel movements and they were bloody. Her hemoglobin hematocrit in the ED is 10.6/31.9 her previous hemoglobin from 06/05/2014 was 11.7/34.4. Patient also had one episode of vomiting. She denied chest pain, no shortness of breath no fever no dysuria urgency or frequency of urination. Patient also underwent CT head as well as CT maxillofacial which were negative for any acute abnormality.   Hospital Course:  Syncope  No further episodes. Likely from dehydration related to  diarrhea in setting of acute blood loss anemia s/p hemorrhoidectomy and possible vasovagal  response. No events on tele.2-D echo with EF 60%, normal wall motion and grade 1 diastolic dysfunction. Troponin negative x3. Not orthostatic. Provided with IV fluids.   Acute blood lossAnemia frequent bloody stools prior to admission and 2 episodes since. Likely related to recent hemorrhoidectomy. Hemoglobin is 9.6 and stable at discharge. Evaluated by general surgery who opine some bloody stool post op is to be expected and blood loss likely not cause of #1 but rather dehydration. Patient had no further episodes. Will follow up with Dr Arnoldo Morale next week.    Rectal pain/bleeding Related to recent (2/8) hemorrhoidectomy and s/p hemorrhoid banding 4 weeks prior to that with complications. Developed ulceration and thrombosed hemorrhoid. resume home pain regimen at discharge.  Diarrhea Reported loose stool mostly blood. c diff negative. No further loose stool  Hypothyroidism TSH within limits of normal.  Anxiety remained stable at baseline.    Procedures:  none  Consultations:  General surgery  Discharge Exam: Filed Vitals:   07/03/14 0610  BP: 112/68  Pulse: 75  Temp: 98.4 F (36.9 C)  Resp: 20    General: well nourished appears comfortable  Cardiovascular: RRR No m/g/r no LE edema Respiratory: normal effort BS clear bilaterally no wheeze Abdomen: soft +BS non-tender.   Discharge Instructions   Discharge Instructions    Activity as tolerated - No restrictions    Complete by:  As directed      Diet general    Complete by:  As directed      Discharge instructions    Complete by:  As directed   Call your physician or seek immediate medical attention for bleeding, pain, passing out  or worsening of condition.          Current Discharge Medication List    START taking these medications   Details  dibucaine (NUPERCAINAL) 1 % OINT Place 1 application rectally daily as needed (for pain). Refills: 0      CONTINUE these medications which have NOT CHANGED    Details  Ferrous Sulfate Dried 45 MG TBCR Take 1 tablet by mouth daily.    KRILL OIL PO Take 2 tablets by mouth daily.     L-LYSINE HCL PO Take 1 tablet by mouth daily.    levothyroxine (SYNTHROID, LEVOTHROID) 100 MCG tablet Take 100 mcg by mouth daily before breakfast.     Multiple Vitamin (MULTIVITAMIN) capsule Take 1 capsule by mouth daily.    traMADol (ULTRAM) 50 MG tablet Take 1 tablet (50 mg total) by mouth every 6 (six) hours as needed. Qty: 40 tablet, Refills: 0    venlafaxine XR (EFFEXOR-XR) 75 MG 24 hr capsule Take 75 mg by mouth daily with breakfast.       STOP taking these medications     ibuprofen (ADVIL,MOTRIN) 200 MG tablet      Lidocaine-Hydrocortisone Ace 3-2.5 % KIT        Allergies  Allergen Reactions  . Codeine Nausea And Vomiting  . Doxycycline Nausea And Vomiting  . Sulfa Antibiotics Rash   Follow-up Information    Follow up with Jamesetta So, MD In 1 week.   Specialty:  General Surgery   Why:  follow up hemorrhoidectomy   Contact information:   [address] [address] [phone]       Follow up with Delphina Cahill, MD. Schedule an appointment as soon as possible for a visit in 1 week.   Specialty:  Internal Medicine   Why:  recommend CBC to track Hg.   Contact information:     [address] [phone]        The results of significant diagnostics from this hospitalization (including imaging, microbiology, ancillary and laboratory) are listed below for reference.    Significant Diagnostic Studies: Ct Head Wo Contrast  07/01/2014   CLINICAL DATA:  Syncope, passed fell now through today with dizziness, abrasion and swelling to nose and upper lip  EXAM: CT HEAD WITHOUT CONTRAST  CT MAXILLOFACIAL WITHOUT CONTRAST  TECHNIQUE: Multidetector CT imaging of the head and maxillofacial structures were performed using the standard protocol without intravenous contrast. Multiplanar CT image reconstructions  of the maxillofacial structures were also generated.  COMPARISON:  11/07/2007  FINDINGS: CT HEAD FINDINGS  Mild diffuse cortical atrophy. Minimal low attenuation in the deep white matter. No evidence of vascular territory infarct. No evidence of mass. No hemorrhage or extra-axial fluid. No hydrocephalus. Calvarium is intact. The visualized portions of the paranasal sinuses demonstrate no significant inflammatory change.  CT MAXILLOFACIAL FINDINGS  There are no facial bone fractures. Nasal bones are normal. Maxilla and mandibles are intact. Orbital roofs and floor normal bilaterally. No significant inflammatory change in the paranasal sinuses. There is small mucous retention cyst noted in the sphenoid sinuses. No air-fluid levels. No significant soft tissue abnormalities over the facial bones.  IMPRESSION: No acute intracranial abnormalities.  No facial bone fractures.   Electronically Signed   By: Skipper Cliche M.D.   On: 07/01/2014 20:03   Ct Maxillofacial Wo Cm  07/01/2014   CLINICAL DATA:  Syncope, passed fell now through today with dizziness, abrasion and swelling to nose and upper lip  EXAM: CT HEAD WITHOUT CONTRAST  CT MAXILLOFACIAL WITHOUT CONTRAST  TECHNIQUE: Multidetector CT imaging of the head and maxillofacial structures were performed using the standard protocol without intravenous contrast. Multiplanar CT image reconstructions of the maxillofacial structures were also generated.  COMPARISON:  11/07/2007  FINDINGS: CT HEAD FINDINGS  Mild diffuse cortical atrophy. Minimal low attenuation in the deep white matter. No evidence of vascular territory infarct. No evidence of mass. No hemorrhage or extra-axial fluid. No hydrocephalus. Calvarium is intact. The visualized portions of the paranasal sinuses demonstrate no significant inflammatory change.  CT MAXILLOFACIAL FINDINGS  There are no facial bone fractures. Nasal bones are normal. Maxilla and mandibles are intact. Orbital roofs and floor normal  bilaterally. No significant inflammatory change in the paranasal sinuses. There is small mucous retention cyst noted in the sphenoid sinuses. No air-fluid levels. No significant soft tissue abnormalities over the facial bones.  IMPRESSION: No acute intracranial abnormalities.  No facial bone fractures.   Electronically Signed   By: Skipper Cliche M.D.   On: 07/01/2014 20:03    Microbiology: Recent Results (from the past 240 hour(s))  Clostridium Difficile by PCR     Status: None   Collection Time: 07/02/14 12:24 AM  Result Value Ref Range Status   C difficile by pcr NEGATIVE NEGATIVE Final     Labs: Basic Metabolic Panel:  Recent Labs Lab 07/01/14 1959 07/02/14 0549  NA 135 144  K 3.7 4.2  CL 105 113*  CO2 28 28  GLUCOSE 164* 100*  BUN 18 11  CREATININE 0.84 0.78  CALCIUM 8.5 8.6   Liver Function Tests:  Recent Labs Lab 07/02/14 0549  AST 17  ALT 13  ALKPHOS 44  BILITOT 0.4  PROT 5.1*  ALBUMIN 2.8*   No results for input(s): LIPASE, AMYLASE in the last 168 hours. No results for input(s): AMMONIA in the last 168 hours. CBC:  Recent Labs Lab 07/01/14 1959 07/02/14 0549 07/02/14 1346 07/03/14 0849  WBC 10.6* 5.8 5.7 6.1  NEUTROABS 8.7*  --   --   --   HGB 10.6* 9.0* 9.2* 9.6*  HCT 31.9* 26.4* 27.0* 28.8*  MCV 87.9 87.7 87.7 88.1  PLT 274 229 226 246   Cardiac Enzymes:  Recent Labs Lab 07/01/14 1959 07/02/14 0549 07/02/14 1056  TROPONINI <0.03 <0.03 <0.03   BNP: BNP (last 3 results) No results for input(s): BNP in the last 8760 hours.  ProBNP (last 3 results) No results for input(s): PROBNP in the last 8760 hours.  CBG: No results for input(s): GLUCAP in the last 168 hours.     SignedRadene Gunning  [facility] 07/03/2014, 3:08 PM

## 2014-07-03 NOTE — Plan of Care (Signed)
Problem: Phase II Progression Outcomes Goal: Progress activity as tolerated unless otherwise ordered Outcome: Completed/Met Date Met:  07/03/14 Patient ambulated in hallway without difficulty this morning.

## 2014-07-25 ENCOUNTER — Encounter: Payer: Self-pay | Admitting: Gastroenterology

## 2014-07-25 ENCOUNTER — Telehealth: Payer: Self-pay | Admitting: Gastroenterology

## 2014-07-25 NOTE — Telephone Encounter (Addendum)
CALLED PT PAPERWORK READY. [place] FAXED TO [phone]. NO MORE PASSING OUT. PT GETTING BACK ON HER FEET. STILL HAS RECTAL PAIN. ENERGY LEVEL GETTING BETTER. OPV E30 HEMORRHOID DISEASE IN 2 MOS.

## 2014-07-25 NOTE — Telephone Encounter (Signed)
MADE APPOINTMENT AND LETTER SENT

## 2014-09-24 ENCOUNTER — Encounter: Payer: Self-pay | Admitting: Gastroenterology

## 2014-09-24 ENCOUNTER — Ambulatory Visit (INDEPENDENT_AMBULATORY_CARE_PROVIDER_SITE_OTHER): Payer: Federal, State, Local not specified - PPO | Admitting: Gastroenterology

## 2014-09-24 VITALS — BP 99/66 | HR 66 | Temp 97.0°F | Ht 64.0 in | Wt 131.0 lb

## 2014-09-24 DIAGNOSIS — K648 Other hemorrhoids: Secondary | ICD-10-CM | POA: Diagnosis not present

## 2014-09-24 NOTE — Progress Notes (Signed)
ON RECALL  °

## 2014-09-24 NOTE — Progress Notes (Signed)
   Subjective:    Patient ID: Stacey Tucker, female    DOB: 1950-12-06, 64 y.o.   MRN: [id number]  Delphina Cahill, MD  HPI BLOOD COUNT RECOVERED. NO MORE HEMORRHOID PAIN, RECTAL BLEEDING, PRESSURE, PAIN, ITCHING, OR BURNING. BACK TO RUNNING UP 4.5 MI 3X/WEEK. BMS: 1 A DAY.  PT DENIES FEVER, CHILLS, HEMATEMESIS, nausea, vomiting, melena, diarrhea, CHEST PAIN, SHORTNESS OF BREATH,  constipation, abdominal pain, problems swallowing, OR heartburn or indigestion.  Past Medical History  Diagnosis Date  . Hemorrhoids 2015: [place] x3  . Hypothyroidism AGE 50  . Anxiety   . Anemia     Past Surgical History  Procedure Laterality Date  . Colonoscopy  2007    [facility]   . Flexible sigmoidoscopy N/A 06/10/2014    SLF: Rectal bleeding/pain due to rectal ulcer caused by hemorrhoid banding. 2. One Hemorrhoid band still in place without evidence of active bleeding  . Stapedectomy Right   . Breast surgery Right   . Hemorrhoid surgery N/A 06/23/2014    Procedure: EXTENSIVE HEMORRHOIDECTOMY;  Surgeon: Jamesetta So, MD;  Location: [facility];  Service: General;  Laterality: N/A;   Allergies  Allergen Reactions  . Codeine Nausea And Vomiting  . Doxycycline Nausea And Vomiting  . Sulfa Antibiotics Rash   Current Outpatient Prescriptions  Medication Sig Dispense Refill  . Ferrous Sulfate Dried 45 MG TBCR Take 1 tablet by mouth daily. STOPPING IT AFTER RX COMPLETE   . KRILL OIL PO Take 2 tablets by mouth daily.     Marland Kitchen L-LYSINE HCL PO Take 1 tablet by mouth daily.    Marland Kitchen levothyroxine (SYNTHROID, LEVOTHROID) 100 MCG tablet Take 100 mcg by mouth daily before breakfast.     . Multiple Vitamin (MULTIVITAMIN) capsule Take 1 capsule by mouth daily.    . traMADol (ULTRAM) 50 MG tablet Take 1 tablet (50 mg total) by mouth every 6 (six) hours as needed.    . venlafaxine XR (EFFEXOR-XR) 75 MG 24 hr capsule Take 75 mg by mouth daily with breakfast.     . dibucaine (NUPERCAINAL) 1 % OINT Place 1 application rectally daily as  needed (for pain). (Patient not taking: Reported on 09/24/2014)     Review of Systems     Objective:   Physical Exam  Constitutional: She is oriented to person, place, and time. She appears well-developed and well-nourished. No distress.  HENT:  Head: Normocephalic and atraumatic.  Mouth/Throat: Oropharynx is clear and moist. No oropharyngeal exudate.  Eyes: Pupils are equal, round, and reactive to light. No scleral icterus.  Neck: Normal range of motion. Neck supple.  Cardiovascular: Normal rate, regular rhythm and normal heart sounds.   Pulmonary/Chest: Effort normal and breath sounds normal. No respiratory distress.  Abdominal: Soft. Bowel sounds are normal. She exhibits no distension. There is no tenderness.  Musculoskeletal: She exhibits no edema.  Lymphadenopathy:    She has no cervical adenopathy.  Neurological: She is alert and oriented to person, place, and time.  NO FOCAL DEFICITS   Psychiatric: She has a normal mood and affect.  Vitals reviewed.         Assessment & Plan:

## 2014-09-24 NOTE — Assessment & Plan Note (Signed)
SYMPTOMS FAIRLY WELL CONTROLLED.  CONTINUE TO MONITOR SYMPTOMS. FOLLOW UP IN 6-12 MOS.

## 2014-09-24 NOTE — Patient Instructions (Signed)
DRINK WATER TO KEEP YOUR URINE LIGHT YELLOW.  FOLLOW A HIGH FIBER DIET. AVOID ITEMS THAT CAUSE BLOATING.   FOLLOW UP IN 6-12 MOS.

## 2014-09-25 NOTE — Progress Notes (Signed)
cc'ed to pcp °

## 2015-02-25 ENCOUNTER — Encounter: Payer: Self-pay | Admitting: Gastroenterology

## 2015-07-21 ENCOUNTER — Other Ambulatory Visit (HOSPITAL_COMMUNITY): Payer: Self-pay | Admitting: Internal Medicine

## 2015-07-21 DIAGNOSIS — R922 Inconclusive mammogram: Secondary | ICD-10-CM

## 2015-07-22 ENCOUNTER — Other Ambulatory Visit (HOSPITAL_COMMUNITY): Payer: Self-pay | Admitting: Internal Medicine

## 2015-07-22 DIAGNOSIS — R922 Inconclusive mammogram: Secondary | ICD-10-CM

## 2015-07-28 ENCOUNTER — Ambulatory Visit (HOSPITAL_COMMUNITY)
Admission: RE | Admit: 2015-07-28 | Discharge: 2015-07-28 | Disposition: A | Discharge status: Home / Self Care | Payer: Federal, State, Local not specified - PPO | Source: Ambulatory Visit | Attending: Internal Medicine | Admitting: Internal Medicine

## 2015-07-28 DIAGNOSIS — R922 Inconclusive mammogram: Secondary | ICD-10-CM | POA: Insufficient documentation

## 2015-08-04 ENCOUNTER — Encounter (HOSPITAL_COMMUNITY): Payer: Federal, State, Local not specified - PPO

## 2016-08-29 ENCOUNTER — Other Ambulatory Visit (HOSPITAL_COMMUNITY): Payer: Self-pay | Admitting: Internal Medicine

## 2016-08-29 DIAGNOSIS — Z1231 Encounter for screening mammogram for malignant neoplasm of breast: Secondary | ICD-10-CM

## 2016-09-09 ENCOUNTER — Ambulatory Visit (HOSPITAL_COMMUNITY)
Admission: RE | Admit: 2016-09-09 | Discharge: 2016-09-09 | Disposition: A | Discharge status: Home / Self Care | Payer: Federal, State, Local not specified - PPO | Source: Ambulatory Visit | Attending: Internal Medicine | Admitting: Internal Medicine

## 2016-09-09 DIAGNOSIS — Z1231 Encounter for screening mammogram for malignant neoplasm of breast: Secondary | ICD-10-CM

## 2017-06-28 ENCOUNTER — Ambulatory Visit: Payer: Federal, State, Local not specified - PPO | Admitting: Gastroenterology

## 2017-06-28 ENCOUNTER — Encounter: Payer: Self-pay | Admitting: Gastroenterology

## 2017-06-28 ENCOUNTER — Other Ambulatory Visit: Payer: Self-pay | Admitting: *Deleted

## 2017-06-28 ENCOUNTER — Encounter: Payer: Self-pay | Admitting: *Deleted

## 2017-06-28 DIAGNOSIS — K648 Other hemorrhoids: Secondary | ICD-10-CM | POA: Diagnosis not present

## 2017-06-28 DIAGNOSIS — K625 Hemorrhage of anus and rectum: Secondary | ICD-10-CM

## 2017-06-28 MED ORDER — CLENPIQ 10-3.5-12 MG-GM -GM/160ML PO SOLN: 1.0000 | Freq: Once | ORAL | 0 refills | Status: AC

## 2017-06-28 MED ORDER — LIDOCAINE-HYDROCORTISONE ACE 3-2.5 % RE KIT
PACK | RECTAL | 0 refills | Status: DC
Start: 2017-06-28 — End: 2019-08-16

## 2017-06-28 NOTE — H&P (View-Only) (Signed)
   Subjective:    Patient ID: Stacey Tucker, female    DOB: Jun 30, 1950, 67 y.o.   MRN: [id number]  Celene Squibb, MD  HPI HAVING PROLAPSE, THEN BURST/BLEEDING, THEN THEY HURT. THAT HAPPENS RARELY. RECENTLY PROLAPSING EVERY DAY. FEELS LIKE ONE BIG ONE. HEMORRHOIDS: NO RECTAL BLEEDING, PRESSURE: YES, NO: ITCHING, BURNING: NO, SOILING: NO. BMs: ONCE A DAY(#3-4).   PT DENIES FEVER, CHILLS, HHEMATEMESIS, nausea, vomiting, melena, diarrhea, CHEST PAIN, SHORTNESS OF BREATH,  CHANGE IN BOWEL IN HABITS, constipation, abdominal pain, problems swallowing, problems with sedation, heartburn or indigestion.  Past Medical History:  Diagnosis Date  . Anemia   . Anxiety   . Hemorrhoids 2015: [place] x3  . Hypothyroidism AGE 72    Past Surgical History:  Procedure Laterality Date  . BREAST SURGERY Right   . COLONOSCOPY  2007   [facility]   . FLEXIBLE SIGMOIDOSCOPY N/A 06/10/2014   SLF: Rectal bleeding/pain due to rectal ulcer caused by hemorrhoid banding. 2. One Hemorrhoid band still in place without evidence of active bleeding  . HEMORRHOID SURGERY N/A 06/23/2014   Procedure: EXTENSIVE HEMORRHOIDECTOMY;  Surgeon: Jamesetta So, MD;  Location: [facility];  Service: General;  Laterality: N/A;  . STAPEDECTOMY Right     Allergies  Allergen Reactions  . Codeine Nausea And Vomiting  . Doxycycline Nausea And Vomiting  . Sulfa Antibiotics Rash   Current Outpatient Medications  Medication Sig Dispense Refill  . Ferrous Sulfate Dried 45 MG TBCR Take 1 tablet by mouth daily.    Marland Kitchen levothyroxine (SYNTHROID, LEVOTHROID) 100 MCG tablet Take 100 mcg by mouth daily before breakfast.     . Multiple Vitamin (MULTIVITAMIN) capsule Take 1 capsule by mouth. Doesn't take everyday    . venlafaxine XR (EFFEXOR-XR) 75 MG 24 hr capsule Take 75 mg by mouth daily with breakfast.     . dibucaine (NUPERCAINAL) 1 % OINT Place 1 application rectally daily as needed (for pain). (Patient not taking: Reported on 09/24/2014)  0  . KRILL OIL PO  Take 2 tablets by mouth daily.     Marland Kitchen L-LYSINE HCL PO Take 1 tablet by mouth daily.    . traMADol (ULTRAM) 50 MG tablet Take 1 tablet (50 mg total) by mouth every 6 (six) hours as needed. (Patient not taking: Reported on 06/28/2017) 40 tablet 0   No current facility-administered medications for this visit.      Review of Systems     Objective:   Physical Exam  Constitutional: She is oriented to person, place, and time. She appears well-developed and well-nourished. No distress.  HENT:  Head: Normocephalic and atraumatic.  Mouth/Throat: Oropharynx is clear and moist. No oropharyngeal exudate.  Eyes: Pupils are equal, round, and reactive to light. No scleral icterus.  Neck: Normal range of motion. Neck supple.  Cardiovascular: Normal rate, regular rhythm and normal heart sounds.  Pulmonary/Chest: Effort normal and breath sounds normal. No respiratory distress.  Abdominal: Soft. Bowel sounds are normal. She exhibits no distension. There is no tenderness.  Musculoskeletal: She exhibits no edema.  Lymphadenopathy:    She has no cervical adenopathy.  Neurological: She is alert and oriented to person, place, and time.  NO FOCAL DEFICITS  Psychiatric: She has a normal mood and affect.  Vitals reviewed.     Assessment & Plan:

## 2017-06-28 NOTE — Patient Instructions (Addendum)
TO RELIEVE RECTAL PAIN/ITCHING OR BLEEDING USE [facility] CREAM FOUR TIMES A DAY FOR 10 DAYS.  DRINK WATER TO KEEP YOUR URINE LIGHT YELLOW.  FOLLOW A HIGH FIBER DIET. AVOID ITEMS THAT CAUSE BLOATING & GAS.  COMPLETE COLONOSCOPY with possible hemorrhoid banding of internal hemorrhoids.  FOLLOW UP IN 4 MOS.     POTENTIAL HEMORRHOIDAL BANDING COMPLICATIONS:  COMMON: 1. MINOR PAIN  UNCOMMON: 1. ABSCESS 2. BAND FALLS OFF 3. PROLAPSE OF HEMORRHOIDS AND PAIN 4. ULCER BLEEDING  A. USUALLY SELF-LIMITED: MAY LAST 3-5 DAYS  B. MAY REQUIRE INTERVENTION: 1-2 WEEKS AFTER INTERACTIONS 5. NECROTIZING PELVIC SEPSIS  A. SYMPTOMS: FEVER, PAIN, DIFFICULTY URINATING

## 2017-06-28 NOTE — Assessment & Plan Note (Signed)
SYMPTOMS NOT IDEALLY CONTROLLED.  TO RELIEVE RECTAL PAIN/ITCHING OR BLEEDING USE [facility] CREAM FOUR TIMES A DAY FOR 10 DAYS.  DRINK WATER TO KEEP YOUR URINE LIGHT YELLOW. FOLLOW A HIGH FIBER DIET. AVOID ITEMS THAT CAUSE BLOATING & GAS. COMPLETE COLONOSCOPY with possible hemorrhoid banding of internal hemorrhoids. DISCUSSED PROCEDURE, BENEFITS, & RISKS. FOLLOW UP IN 4 MOS.

## 2017-06-28 NOTE — Progress Notes (Signed)
   Subjective:    Patient ID: Stacey Tucker, female    DOB: 1951-04-02, 67 y.o.   MRN: [id number]  Celene Squibb, MD  HPI HAVING PROLAPSE, THEN BURST/BLEEDING, THEN THEY HURT. THAT HAPPENS RARELY. RECENTLY PROLAPSING EVERY DAY. FEELS LIKE ONE BIG ONE. HEMORRHOIDS: NO RECTAL BLEEDING, PRESSURE: YES, NO: ITCHING, BURNING: NO, SOILING: NO. BMs: ONCE A DAY(#3-4).   PT DENIES FEVER, CHILLS, HHEMATEMESIS, nausea, vomiting, melena, diarrhea, CHEST PAIN, SHORTNESS OF BREATH,  CHANGE IN BOWEL IN HABITS, constipation, abdominal pain, problems swallowing, problems with sedation, heartburn or indigestion.  Past Medical History:  Diagnosis Date  . Anemia   . Anxiety   . Hemorrhoids 2015: [place] x3  . Hypothyroidism AGE 89    Past Surgical History:  Procedure Laterality Date  . BREAST SURGERY Right   . COLONOSCOPY  2007   [facility]   . FLEXIBLE SIGMOIDOSCOPY N/A 06/10/2014   SLF: Rectal bleeding/pain due to rectal ulcer caused by hemorrhoid banding. 2. One Hemorrhoid band still in place without evidence of active bleeding  . HEMORRHOID SURGERY N/A 06/23/2014   Procedure: EXTENSIVE HEMORRHOIDECTOMY;  Surgeon: Jamesetta So, MD;  Location: [facility];  Service: General;  Laterality: N/A;  . STAPEDECTOMY Right     Allergies  Allergen Reactions  . Codeine Nausea And Vomiting  . Doxycycline Nausea And Vomiting  . Sulfa Antibiotics Rash   Current Outpatient Medications  Medication Sig Dispense Refill  . Ferrous Sulfate Dried 45 MG TBCR Take 1 tablet by mouth daily.    Marland Kitchen levothyroxine (SYNTHROID, LEVOTHROID) 100 MCG tablet Take 100 mcg by mouth daily before breakfast.     . Multiple Vitamin (MULTIVITAMIN) capsule Take 1 capsule by mouth. Doesn't take everyday    . venlafaxine XR (EFFEXOR-XR) 75 MG 24 hr capsule Take 75 mg by mouth daily with breakfast.     . dibucaine (NUPERCAINAL) 1 % OINT Place 1 application rectally daily as needed (for pain). (Patient not taking: Reported on 09/24/2014)  0  . KRILL OIL PO  Take 2 tablets by mouth daily.     Marland Kitchen L-LYSINE HCL PO Take 1 tablet by mouth daily.    . traMADol (ULTRAM) 50 MG tablet Take 1 tablet (50 mg total) by mouth every 6 (six) hours as needed. (Patient not taking: Reported on 06/28/2017) 40 tablet 0   No current facility-administered medications for this visit.      Review of Systems     Objective:   Physical Exam  Constitutional: She is oriented to person, place, and time. She appears well-developed and well-nourished. No distress.  HENT:  Head: Normocephalic and atraumatic.  Mouth/Throat: Oropharynx is clear and moist. No oropharyngeal exudate.  Eyes: Pupils are equal, round, and reactive to light. No scleral icterus.  Neck: Normal range of motion. Neck supple.  Cardiovascular: Normal rate, regular rhythm and normal heart sounds.  Pulmonary/Chest: Effort normal and breath sounds normal. No respiratory distress.  Abdominal: Soft. Bowel sounds are normal. She exhibits no distension. There is no tenderness.  Musculoskeletal: She exhibits no edema.  Lymphadenopathy:    She has no cervical adenopathy.  Neurological: She is alert and oriented to person, place, and time.  NO FOCAL DEFICITS  Psychiatric: She has a normal mood and affect.  Vitals reviewed.     Assessment & Plan:

## 2017-06-29 NOTE — Progress Notes (Signed)
ON RECALL  °

## 2017-06-29 NOTE — Progress Notes (Signed)
CC'D TO PCP °

## 2017-06-30 ENCOUNTER — Telehealth: Payer: Self-pay | Admitting: Gastroenterology

## 2017-06-30 NOTE — Telephone Encounter (Signed)
PATIENT SAID THAT HER INSURANCE WOULD COST 67$ FOR HER PREP, SHE SAID SHE WOULD LET YOU KNOW. JUST FOR REFERENCE

## 2017-06-30 NOTE — Telephone Encounter (Signed)
noted 

## 2017-07-14 ENCOUNTER — Ambulatory Visit (HOSPITAL_COMMUNITY)
Admission: RE | Admit: 2017-07-14 | Discharge: 2017-07-14 | Disposition: A | Discharge status: Home / Self Care | Payer: Medicare Other | Source: Ambulatory Visit | Attending: Gastroenterology | Admitting: Gastroenterology

## 2017-07-14 ENCOUNTER — Other Ambulatory Visit: Payer: Self-pay

## 2017-07-14 ENCOUNTER — Encounter (HOSPITAL_COMMUNITY)
Admission: RE | Disposition: A | Discharge status: Home / Self Care | Payer: Self-pay | Source: Ambulatory Visit | Attending: Gastroenterology

## 2017-07-14 ENCOUNTER — Encounter (HOSPITAL_COMMUNITY): Payer: Self-pay | Admitting: *Deleted

## 2017-07-14 DIAGNOSIS — K625 Hemorrhage of anus and rectum: Secondary | ICD-10-CM

## 2017-07-14 DIAGNOSIS — Z885 Allergy status to narcotic agent status: Secondary | ICD-10-CM | POA: Insufficient documentation

## 2017-07-14 DIAGNOSIS — F419 Anxiety disorder, unspecified: Secondary | ICD-10-CM | POA: Insufficient documentation

## 2017-07-14 DIAGNOSIS — Q438 Other specified congenital malformations of intestine: Secondary | ICD-10-CM | POA: Diagnosis not present

## 2017-07-14 DIAGNOSIS — K648 Other hemorrhoids: Secondary | ICD-10-CM

## 2017-07-14 DIAGNOSIS — Z882 Allergy status to sulfonamides status: Secondary | ICD-10-CM | POA: Insufficient documentation

## 2017-07-14 DIAGNOSIS — E039 Hypothyroidism, unspecified: Secondary | ICD-10-CM | POA: Diagnosis not present

## 2017-07-14 DIAGNOSIS — K642 Third degree hemorrhoids: Secondary | ICD-10-CM | POA: Insufficient documentation

## 2017-07-14 DIAGNOSIS — K635 Polyp of colon: Secondary | ICD-10-CM | POA: Insufficient documentation

## 2017-07-14 DIAGNOSIS — D175 Benign lipomatous neoplasm of intra-abdominal organs: Secondary | ICD-10-CM | POA: Diagnosis not present

## 2017-07-14 DIAGNOSIS — K921 Melena: Secondary | ICD-10-CM | POA: Diagnosis not present

## 2017-07-14 DIAGNOSIS — Z881 Allergy status to other antibiotic agents status: Secondary | ICD-10-CM | POA: Insufficient documentation

## 2017-07-14 DIAGNOSIS — Z79899 Other long term (current) drug therapy: Secondary | ICD-10-CM | POA: Insufficient documentation

## 2017-07-14 HISTORY — PX: COLONOSCOPY: SHX5424

## 2017-07-14 HISTORY — PX: HEMORRHOID BANDING: SHX5850

## 2017-07-14 SURGERY — COLONOSCOPY
Anesthesia: Moderate Sedation

## 2017-07-14 MED ORDER — ONDANSETRON HCL 4 MG/2ML IJ SOLN
INTRAMUSCULAR | Status: AC
  Filled 2017-07-14: qty 2

## 2017-07-14 MED ORDER — SODIUM CHLORIDE 0.9 % IV SOLN
INTRAVENOUS | Status: DC
  Administered 2017-07-14: 15:00:00 via INTRAVENOUS

## 2017-07-14 MED ORDER — MIDAZOLAM HCL 5 MG/5ML IJ SOLN
INTRAMUSCULAR | Status: AC
  Filled 2017-07-14: qty 10

## 2017-07-14 MED ORDER — MIDAZOLAM HCL 5 MG/5ML IJ SOLN
INTRAMUSCULAR | Status: DC | PRN
  Administered 2017-07-14 (×2): 2 mg via INTRAVENOUS

## 2017-07-14 MED ORDER — MEPERIDINE HCL 100 MG/ML IJ SOLN
INTRAMUSCULAR | Status: AC
  Filled 2017-07-14: qty 2

## 2017-07-14 MED ORDER — ONDANSETRON HCL 4 MG/2ML IJ SOLN
INTRAMUSCULAR | Status: DC | PRN
  Administered 2017-07-14: 4 mg via INTRAVENOUS

## 2017-07-14 MED ORDER — MEPERIDINE HCL 100 MG/ML IJ SOLN
INTRAMUSCULAR | Status: DC | PRN
  Administered 2017-07-14 (×2): 25 mg

## 2017-07-14 NOTE — Op Note (Signed)
[facility] Patient Name: Stacey Tucker Procedure Date: 07/14/2017 3:15 PM MRN: [id number] Date of Birth: [birth year] Attending MD: Barney Drain MD, MD CSN: [id number] Age: 67 Admit Type: Ambulatory Procedure:                Colonoscopy WITH COLD FORCEPS BIOPSY/HEMORRHOID                            BANDING x2 Indications:              Hematochezia Providers:                Barney Drain MD, MD, Janeece Riggers, RN, Nelma Rothman,                            Technician Referring MD:             Edwinna Areola. [place] Crane MD Medicines:                Meperidine 50 mg IV, Midazolam 4 mg IV Complications:            No immediate complications. Estimated Blood Loss:     Estimated blood loss was minimal. Procedure:                Pre-Anesthesia Assessment:                           - Prior to the procedure, a History and Physical                            was performed, and patient medications and                            allergies were reviewed. The patient's tolerance of                            previous anesthesia was also reviewed. The risks                            and benefits of the procedure and the sedation                            options and risks were discussed with the patient.                            All questions were answered, and informed consent                            was obtained. Prior Anticoagulants: The patient has                            taken no previous anticoagulant or antiplatelet                            agents. ASA Grade Assessment: II - A patient with  mild systemic disease. After reviewing the risks                            and benefits, the patient was deemed in                            satisfactory condition to undergo the procedure.                            After obtaining informed consent, the colonoscope                            was passed under direct vision. Throughout the                            procedure, the  patient's blood pressure, pulse, and                            oxygen saturations were monitored continuously. The                            EC-[id number] ([id number]) scope was introduced through                            the anus and advanced to the the cecum, identified                            by appendiceal orifice and ileocecal valve. The                            colonoscopy was somewhat difficult due to a                            tortuous colon. Successful completion of the                            procedure was aided by straightening and shortening                            the scope to obtain bowel loop reduction and                            COLOWRAP. The patient tolerated the procedure well.                            The quality of the bowel preparation was excellent.                            The ileocecal valve, appendiceal orifice, and                            rectum were photographed. The ileocecal valve,  appendiceal orifice, and rectum were photographed. Scope In: 3:38:54 PM Scope Out: 3:54:28 PM Scope Withdrawal Time: 0 hours 11 minutes 37 seconds  Total Procedure Duration: 0 hours 15 minutes 34 seconds  Findings:      There was a medium-sized lipoma, 29 to 30 mm in diameter, in the distal       ascending colon. This was biopsied with a cold forceps for histology.      Non-bleeding prolapsed internal hemorrhoids were found during       retroflexion and during perianal exam. The hemorrhoids were Grade III       (internal hemorrhoids that prolapse but require manual reduction). Two       bands were successfully placed. There was no bleeding at the end of the       procedure.      The recto-sigmoid colon and sigmoid colon were moderately redundant. Impression:               - PROBABLE Medium-sized lipoma in the distal                            ascending colon. Biopsied.                           - RECTAL BLEEDING DUE TO internal  hemorrhoids.                            Banded.                           - Redundant LEFT colon. Moderate Sedation:      Moderate (conscious) sedation was administered by the endoscopy nurse       and supervised by the endoscopist. The following parameters were       monitored: oxygen saturation, heart rate, blood pressure, and response       to care. Total physician intraservice time was 45 minutes. Recommendation:           - High fiber diet.                           - Continue present medications.                           - Await pathology results.                           - Repeat colonoscopy in 10 years for surveillance.                           - Patient has a contact number available for                            emergencies. The signs and symptoms of potential                            delayed complications were discussed with the                            patient. Return to normal  activities tomorrow.                            Written discharge instructions were provided to the                            patient. Procedure Code(s):        --- Professional ---                           [phone], Colonoscopy, flexible; with band ligation(s)                            (eg, hemorrhoids)                           45380, Colonoscopy, flexible; with biopsy, single                            or multiple                           99152, Moderate sedation services provided by the                            same physician or other qualified health care                            professional performing the diagnostic or                            therapeutic service that the sedation supports,                            requiring the presence of an independent trained                            observer to assist in the monitoring of the                            patient's level of consciousness and physiological                            status; initial 15 minutes of intraservice  time,                            patient age 37 years or older                           [phone], Moderate sedation services; each additional                            15 minutes intraservice time                           99153, Moderate sedation services; each additional  15 minutes intraservice time Diagnosis Code(s):        --- Professional ---                           K64.2, Third degree hemorrhoids                           D17.5, Benign lipomatous neoplasm of                            intra-abdominal organs                           K92.1, Melena (includes Hematochezia)                           Q43.8, Other specified congenital malformations of                            intestine CPT copyright 2016 [employer]. All rights reserved. The codes documented in this report are preliminary and upon coder review may  be revised to meet current compliance requirements. Barney Drain, MD Barney Drain MD, MD 07/14/2017 4:45:37 PM This report has been signed electronically. Number of Addenda: 0

## 2017-07-14 NOTE — Discharge Instructions (Signed)
YOU HAVE HAVE A LIPOMA IN YOUR RIGHT COLON. I BIOPSIED IT.  You HAD LARGE internal AND MODERATE EXTERNALhemorrhoids. YOU DID NOT HAVE ANY POLYPS. I PLACED 2 BANDS TO STOP THE INTERNAL HEMORRHOIDAL BLEEDING. YOU MAY SEE SOME MILD BLEEDING OVER THE NEXT 3 TO 5 DAYS.   PLEASE CALL [phone] IF YOU HAVE A FEVER, A LARGE AMOUNT OF BLEEDING, OR DIFFICULTY URINATING.  DRINK WATER TO KEEP URINE LIGHT YELLOW.  USE IBUPROFEN THREE TIMES  A DAY OR NAPROXEN TWICE DAILY IF NEEDED FOR RECTAL DISCOMFORT OR TYLENOL IF NEEDED FOR ADDITIONAL PAIN RELIEF.  IF NEEDED USE COLACE TWICE DAILY TO SOFTEN STOOL.  FOLLOW A HIGH FIBER DIET. AVOID ITEMS THAT CAUSE BLOATING & GAS. SEE INFO BELOW.  Next colonoscopy AT AGE 67.    Colonoscopy Care After Read the instructions outlined below and refer to this sheet in the next week. These discharge instructions provide you with general information on caring for yourself after you leave the hospital. While your treatment has been planned according to the most current medical practices available, unavoidable complications occasionally occur. If you have any problems or questions after discharge, call DR. Nash Bolls, [phone].  ACTIVITY  You may resume your regular activity, but move at a slower pace for the next 24 hours.   Take frequent rest periods for the next 24 hours.   Walking will help get rid of the air and reduce the bloated feeling in your belly (abdomen).   No driving for 24 hours (because of the medicine (anesthesia) used during the test).   You may shower.   Do not sign any important legal documents or operate any machinery for 24 hours (because of the anesthesia used during the test).    NUTRITION  Drink plenty of fluids.   You may resume your normal diet as instructed by your doctor.   Begin with a light meal and progress to your normal diet. Heavy or fried foods are harder to digest and may make you feel sick to your stomach (nauseated).    Avoid alcoholic beverages for 24 hours or as instructed.    MEDICATIONS  You may resume your normal medications.   WHAT YOU CAN EXPECT TODAY  Some feelings of bloating in the abdomen.   Passage of more gas than usual.   Spotting of blood in your stool or on the toilet paper  .  IF YOU HAD POLYPS REMOVED DURING THE COLONOSCOPY:  Eat a soft diet IF YOU HAVE NAUSEA, BLOATING, ABDOMINAL PAIN, OR VOMITING.    FINDING OUT THE RESULTS OF YOUR TEST Not all test results are available during your visit. DR. Oneida Alar WILL CALL YOU WITHIN 14 DAYS OF YOUR PROCEDUE WITH YOUR RESULTS. Do not assume everything is normal if you have not heard from DR. Benaiah Behan, CALL HER OFFICE AT [phone].  SEEK IMMEDIATE MEDICAL ATTENTION AND CALL THE OFFICE: [phone] IF:  You have more than a spotting of blood in your stool.   Your belly is swollen (abdominal distention).   You are nauseated or vomiting.   You have a temperature over 101F.   You have abdominal pain or discomfort that is severe or gets worse throughout the day.   Hemorrhoids Hemorrhoids are dilated (enlarged) veins around the rectum. Sometimes clots will form in the veins. This makes them swollen and painful. These are called thrombosed hemorrhoids. Causes of hemorrhoids include:  Constipation.   Straining to have a bowel movement.   HEAVY LIFTING  HOME CARE INSTRUCTIONS  Eat  a well balanced diet and drink 6 to 8 glasses of water every day to avoid constipation. You may also use a bulk laxative.   Avoid straining to have bowel movements.   Keep anal area dry and clean.   Do not use a donut shaped pillow or sit on the toilet for long periods. This increases blood pooling and pain.   Move your bowels when your body has the urge; this will require less straining and will decrease pain and pressure.  High-Fiber Diet A high-fiber diet changes your normal diet to include more whole grains, legumes, fruits, and  vegetables. Changes in the diet involve replacing refined carbohydrates with unrefined foods. The calorie level of the diet is essentially unchanged. The Dietary Reference Intake (recommended amount) for adult males is 38 grams per day. For adult females, it is 25 grams per day. Pregnant and lactating women should consume 28 grams of fiber per day. Fiber is the intact part of a plant that is not broken down during digestion. Functional fiber is fiber that has been isolated from the plant to provide a beneficial effect in the body. PURPOSE  Increase stool bulk.   Ease and regulate bowel movements.   Lower cholesterol.   REDUCE RISK OF COLON CANCER  INDICATIONS THAT YOU NEED MORE FIBER  Constipation and hemorrhoids.   Uncomplicated diverticulosis (intestine condition) and irritable bowel syndrome.   Weight management.   As a protective measure against hardening of the arteries (atherosclerosis), diabetes, and cancer.   GUIDELINES FOR INCREASING FIBER IN THE DIET  Start adding fiber to the diet slowly. A gradual increase of about 5 more grams (2 slices of whole-wheat bread, 2 servings of most fruits or vegetables, or 1 bowl of high-fiber cereal) per day is best. Too rapid an increase in fiber may result in constipation, flatulence, and bloating.   Drink enough water and fluids to keep your urine clear or pale yellow. Water, juice, or caffeine-free drinks are recommended. Not drinking enough fluid may cause constipation.   Eat a variety of high-fiber foods rather than one type of fiber.   Try to increase your intake of fiber through using high-fiber foods rather than fiber pills or supplements that contain small amounts of fiber.   The goal is to change the types of food eaten. Do not supplement your present diet with high-fiber foods, but replace foods in your present diet.   INCLUDE A VARIETY OF FIBER SOURCES  Replace refined and processed grains with whole grains, canned fruits  with fresh fruits, and incorporate other fiber sources. White rice, white breads, and most bakery goods contain little or no fiber.   Brown whole-grain rice, buckwheat oats, and many fruits and vegetables are all good sources of fiber. These include: broccoli, Brussels sprouts, cabbage, cauliflower, beets, sweet potatoes, white potatoes (skin on), carrots, tomatoes, eggplant, squash, berries, fresh fruits, and dried fruits.   Cereals appear to be the richest source of fiber. Cereal fiber is found in whole grains and bran. Bran is the fiber-rich outer coat of cereal grain, which is largely removed in refining. In whole-grain cereals, the bran remains. In breakfast cereals, the largest amount of fiber is found in those with "bran" in their names. The fiber content is sometimes indicated on the label.   You may need to include additional fruits and vegetables each day.   In baking, for 1 cup white flour, you may use the following substitutions:   1 cup whole-wheat flour minus 2 tablespoons.  1/2 cup white flour plus 1/2 cup whole-wheat flour.

## 2017-07-14 NOTE — Interval H&P Note (Signed)
History and Physical Interval Note:  07/14/2017 3:17 PM  Stacey Tucker  has presented today for surgery, with the diagnosis of rectal bleeding  The various methods of treatment have been discussed with the patient and family. After consideration of risks, benefits and other options for treatment, the patient has consented to  Procedure(s) with comments: COLONOSCOPY (N/A) - 2:45pm HEMORRHOID BANDING (N/A) as a surgical intervention .  The patient's history has been reviewed, patient examined, no change in status, stable for surgery.  I have reviewed the patient's chart and labs.  Questions were answered to the patient's satisfaction.     [employer]

## 2017-07-18 ENCOUNTER — Encounter (HOSPITAL_COMMUNITY): Payer: Self-pay | Admitting: Gastroenterology

## 2017-07-19 NOTE — Progress Notes (Signed)
Pt is aware.  

## 2017-07-20 ENCOUNTER — Encounter (HOSPITAL_COMMUNITY): Payer: Self-pay | Admitting: Gastroenterology

## 2017-08-10 DIAGNOSIS — M9901 Segmental and somatic dysfunction of cervical region: Secondary | ICD-10-CM | POA: Diagnosis not present

## 2017-08-10 DIAGNOSIS — M9902 Segmental and somatic dysfunction of thoracic region: Secondary | ICD-10-CM | POA: Diagnosis not present

## 2017-08-10 DIAGNOSIS — M9903 Segmental and somatic dysfunction of lumbar region: Secondary | ICD-10-CM | POA: Diagnosis not present

## 2017-08-10 DIAGNOSIS — M542 Cervicalgia: Secondary | ICD-10-CM | POA: Diagnosis not present

## 2017-08-10 DIAGNOSIS — M546 Pain in thoracic spine: Secondary | ICD-10-CM | POA: Diagnosis not present

## 2017-08-30 DIAGNOSIS — M546 Pain in thoracic spine: Secondary | ICD-10-CM | POA: Diagnosis not present

## 2017-08-30 DIAGNOSIS — M9903 Segmental and somatic dysfunction of lumbar region: Secondary | ICD-10-CM | POA: Diagnosis not present

## 2017-08-30 DIAGNOSIS — M9901 Segmental and somatic dysfunction of cervical region: Secondary | ICD-10-CM | POA: Diagnosis not present

## 2017-08-30 DIAGNOSIS — M9902 Segmental and somatic dysfunction of thoracic region: Secondary | ICD-10-CM | POA: Diagnosis not present

## 2017-08-30 DIAGNOSIS — M542 Cervicalgia: Secondary | ICD-10-CM | POA: Diagnosis not present

## 2017-09-13 ENCOUNTER — Encounter: Payer: Self-pay | Admitting: Gastroenterology

## 2017-09-21 ENCOUNTER — Other Ambulatory Visit (HOSPITAL_COMMUNITY): Payer: Self-pay | Admitting: Internal Medicine

## 2017-09-21 DIAGNOSIS — Z1231 Encounter for screening mammogram for malignant neoplasm of breast: Secondary | ICD-10-CM

## 2017-09-26 DIAGNOSIS — E039 Hypothyroidism, unspecified: Secondary | ICD-10-CM | POA: Diagnosis not present

## 2017-09-26 DIAGNOSIS — E782 Mixed hyperlipidemia: Secondary | ICD-10-CM | POA: Diagnosis not present

## 2017-09-28 DIAGNOSIS — E782 Mixed hyperlipidemia: Secondary | ICD-10-CM | POA: Diagnosis not present

## 2017-09-28 DIAGNOSIS — Z6822 Body mass index (BMI) 22.0-22.9, adult: Secondary | ICD-10-CM | POA: Diagnosis not present

## 2017-09-28 DIAGNOSIS — E039 Hypothyroidism, unspecified: Secondary | ICD-10-CM | POA: Diagnosis not present

## 2017-09-28 DIAGNOSIS — Z0000 Encounter for general adult medical examination without abnormal findings: Secondary | ICD-10-CM | POA: Diagnosis not present

## 2017-09-28 DIAGNOSIS — F331 Major depressive disorder, recurrent, moderate: Secondary | ICD-10-CM | POA: Diagnosis not present

## 2017-10-02 ENCOUNTER — Ambulatory Visit (HOSPITAL_COMMUNITY)
Admission: RE | Admit: 2017-10-02 | Discharge: 2017-10-02 | Disposition: A | Discharge status: Home / Self Care | Payer: Medicare Other | Source: Ambulatory Visit | Attending: Internal Medicine | Admitting: Internal Medicine

## 2017-10-02 ENCOUNTER — Encounter (HOSPITAL_COMMUNITY): Payer: Self-pay

## 2017-10-02 DIAGNOSIS — Z1231 Encounter for screening mammogram for malignant neoplasm of breast: Secondary | ICD-10-CM | POA: Insufficient documentation

## 2017-10-17 DIAGNOSIS — M542 Cervicalgia: Secondary | ICD-10-CM | POA: Diagnosis not present

## 2017-10-17 DIAGNOSIS — M9901 Segmental and somatic dysfunction of cervical region: Secondary | ICD-10-CM | POA: Diagnosis not present

## 2017-10-17 DIAGNOSIS — M546 Pain in thoracic spine: Secondary | ICD-10-CM | POA: Diagnosis not present

## 2017-10-17 DIAGNOSIS — M9902 Segmental and somatic dysfunction of thoracic region: Secondary | ICD-10-CM | POA: Diagnosis not present

## 2017-10-17 DIAGNOSIS — M9903 Segmental and somatic dysfunction of lumbar region: Secondary | ICD-10-CM | POA: Diagnosis not present

## 2017-11-01 DIAGNOSIS — E039 Hypothyroidism, unspecified: Secondary | ICD-10-CM | POA: Diagnosis not present

## 2017-11-29 DIAGNOSIS — M545 Low back pain: Secondary | ICD-10-CM | POA: Diagnosis not present

## 2017-11-29 DIAGNOSIS — M542 Cervicalgia: Secondary | ICD-10-CM | POA: Diagnosis not present

## 2017-11-29 DIAGNOSIS — M9902 Segmental and somatic dysfunction of thoracic region: Secondary | ICD-10-CM | POA: Diagnosis not present

## 2017-11-29 DIAGNOSIS — M9901 Segmental and somatic dysfunction of cervical region: Secondary | ICD-10-CM | POA: Diagnosis not present

## 2017-11-29 DIAGNOSIS — M9903 Segmental and somatic dysfunction of lumbar region: Secondary | ICD-10-CM | POA: Diagnosis not present

## 2017-11-29 DIAGNOSIS — M546 Pain in thoracic spine: Secondary | ICD-10-CM | POA: Diagnosis not present

## 2018-01-02 DIAGNOSIS — M545 Low back pain: Secondary | ICD-10-CM | POA: Diagnosis not present

## 2018-01-02 DIAGNOSIS — M9902 Segmental and somatic dysfunction of thoracic region: Secondary | ICD-10-CM | POA: Diagnosis not present

## 2018-01-02 DIAGNOSIS — M9901 Segmental and somatic dysfunction of cervical region: Secondary | ICD-10-CM | POA: Diagnosis not present

## 2018-01-02 DIAGNOSIS — M546 Pain in thoracic spine: Secondary | ICD-10-CM | POA: Diagnosis not present

## 2018-01-02 DIAGNOSIS — M9903 Segmental and somatic dysfunction of lumbar region: Secondary | ICD-10-CM | POA: Diagnosis not present

## 2018-01-02 DIAGNOSIS — M542 Cervicalgia: Secondary | ICD-10-CM | POA: Diagnosis not present

## 2018-01-30 DIAGNOSIS — M545 Low back pain: Secondary | ICD-10-CM | POA: Diagnosis not present

## 2018-01-30 DIAGNOSIS — M546 Pain in thoracic spine: Secondary | ICD-10-CM | POA: Diagnosis not present

## 2018-01-30 DIAGNOSIS — M9903 Segmental and somatic dysfunction of lumbar region: Secondary | ICD-10-CM | POA: Diagnosis not present

## 2018-01-30 DIAGNOSIS — M9901 Segmental and somatic dysfunction of cervical region: Secondary | ICD-10-CM | POA: Diagnosis not present

## 2018-01-30 DIAGNOSIS — M9902 Segmental and somatic dysfunction of thoracic region: Secondary | ICD-10-CM | POA: Diagnosis not present

## 2018-01-30 DIAGNOSIS — M542 Cervicalgia: Secondary | ICD-10-CM | POA: Diagnosis not present

## 2018-02-27 DIAGNOSIS — M545 Low back pain: Secondary | ICD-10-CM | POA: Diagnosis not present

## 2018-02-27 DIAGNOSIS — M9903 Segmental and somatic dysfunction of lumbar region: Secondary | ICD-10-CM | POA: Diagnosis not present

## 2018-02-27 DIAGNOSIS — M9902 Segmental and somatic dysfunction of thoracic region: Secondary | ICD-10-CM | POA: Diagnosis not present

## 2018-02-27 DIAGNOSIS — M546 Pain in thoracic spine: Secondary | ICD-10-CM | POA: Diagnosis not present

## 2018-02-27 DIAGNOSIS — M9901 Segmental and somatic dysfunction of cervical region: Secondary | ICD-10-CM | POA: Diagnosis not present

## 2018-02-27 DIAGNOSIS — M542 Cervicalgia: Secondary | ICD-10-CM | POA: Diagnosis not present

## 2018-04-24 DIAGNOSIS — M546 Pain in thoracic spine: Secondary | ICD-10-CM | POA: Diagnosis not present

## 2018-04-24 DIAGNOSIS — M9901 Segmental and somatic dysfunction of cervical region: Secondary | ICD-10-CM | POA: Diagnosis not present

## 2018-04-24 DIAGNOSIS — M9903 Segmental and somatic dysfunction of lumbar region: Secondary | ICD-10-CM | POA: Diagnosis not present

## 2018-04-24 DIAGNOSIS — M542 Cervicalgia: Secondary | ICD-10-CM | POA: Diagnosis not present

## 2018-04-24 DIAGNOSIS — M545 Low back pain: Secondary | ICD-10-CM | POA: Diagnosis not present

## 2018-04-24 DIAGNOSIS — M9902 Segmental and somatic dysfunction of thoracic region: Secondary | ICD-10-CM | POA: Diagnosis not present

## 2018-06-08 DIAGNOSIS — K649 Unspecified hemorrhoids: Secondary | ICD-10-CM | POA: Diagnosis not present

## 2018-07-04 DIAGNOSIS — M9902 Segmental and somatic dysfunction of thoracic region: Secondary | ICD-10-CM | POA: Diagnosis not present

## 2018-07-04 DIAGNOSIS — M546 Pain in thoracic spine: Secondary | ICD-10-CM | POA: Diagnosis not present

## 2018-07-04 DIAGNOSIS — M9901 Segmental and somatic dysfunction of cervical region: Secondary | ICD-10-CM | POA: Diagnosis not present

## 2018-07-04 DIAGNOSIS — M9903 Segmental and somatic dysfunction of lumbar region: Secondary | ICD-10-CM | POA: Diagnosis not present

## 2018-07-04 DIAGNOSIS — M542 Cervicalgia: Secondary | ICD-10-CM | POA: Diagnosis not present

## 2018-07-04 DIAGNOSIS — M545 Low back pain: Secondary | ICD-10-CM | POA: Diagnosis not present

## 2018-08-13 DIAGNOSIS — K642 Third degree hemorrhoids: Secondary | ICD-10-CM | POA: Diagnosis not present

## 2018-08-22 ENCOUNTER — Ambulatory Visit: Payer: Federal, State, Local not specified - PPO | Admitting: Gastroenterology

## 2018-09-05 DIAGNOSIS — M9902 Segmental and somatic dysfunction of thoracic region: Secondary | ICD-10-CM | POA: Diagnosis not present

## 2018-09-05 DIAGNOSIS — M546 Pain in thoracic spine: Secondary | ICD-10-CM | POA: Diagnosis not present

## 2018-09-05 DIAGNOSIS — M542 Cervicalgia: Secondary | ICD-10-CM | POA: Diagnosis not present

## 2018-09-05 DIAGNOSIS — M9903 Segmental and somatic dysfunction of lumbar region: Secondary | ICD-10-CM | POA: Diagnosis not present

## 2018-09-05 DIAGNOSIS — M9901 Segmental and somatic dysfunction of cervical region: Secondary | ICD-10-CM | POA: Diagnosis not present

## 2018-09-05 DIAGNOSIS — M545 Low back pain: Secondary | ICD-10-CM | POA: Diagnosis not present

## 2018-09-05 DIAGNOSIS — M7061 Trochanteric bursitis, right hip: Secondary | ICD-10-CM | POA: Diagnosis not present

## 2018-10-01 DIAGNOSIS — E782 Mixed hyperlipidemia: Secondary | ICD-10-CM | POA: Diagnosis not present

## 2018-10-01 DIAGNOSIS — E039 Hypothyroidism, unspecified: Secondary | ICD-10-CM | POA: Diagnosis not present

## 2018-10-04 DIAGNOSIS — Z0001 Encounter for general adult medical examination with abnormal findings: Secondary | ICD-10-CM | POA: Diagnosis not present

## 2018-10-04 DIAGNOSIS — K648 Other hemorrhoids: Secondary | ICD-10-CM | POA: Diagnosis not present

## 2018-10-04 DIAGNOSIS — S76311D Strain of muscle, fascia and tendon of the posterior muscle group at thigh level, right thigh, subsequent encounter: Secondary | ICD-10-CM | POA: Diagnosis not present

## 2018-10-04 DIAGNOSIS — E782 Mixed hyperlipidemia: Secondary | ICD-10-CM | POA: Diagnosis not present

## 2018-10-04 DIAGNOSIS — E039 Hypothyroidism, unspecified: Secondary | ICD-10-CM | POA: Diagnosis not present

## 2018-10-04 DIAGNOSIS — F331 Major depressive disorder, recurrent, moderate: Secondary | ICD-10-CM | POA: Diagnosis not present

## 2018-10-17 DIAGNOSIS — M9902 Segmental and somatic dysfunction of thoracic region: Secondary | ICD-10-CM | POA: Diagnosis not present

## 2018-10-17 DIAGNOSIS — M9903 Segmental and somatic dysfunction of lumbar region: Secondary | ICD-10-CM | POA: Diagnosis not present

## 2018-10-17 DIAGNOSIS — M542 Cervicalgia: Secondary | ICD-10-CM | POA: Diagnosis not present

## 2018-10-17 DIAGNOSIS — M7061 Trochanteric bursitis, right hip: Secondary | ICD-10-CM | POA: Diagnosis not present

## 2018-10-17 DIAGNOSIS — M546 Pain in thoracic spine: Secondary | ICD-10-CM | POA: Diagnosis not present

## 2018-10-17 DIAGNOSIS — M545 Low back pain: Secondary | ICD-10-CM | POA: Diagnosis not present

## 2018-10-17 DIAGNOSIS — M9901 Segmental and somatic dysfunction of cervical region: Secondary | ICD-10-CM | POA: Diagnosis not present

## 2018-10-26 ENCOUNTER — Other Ambulatory Visit (HOSPITAL_COMMUNITY): Payer: Self-pay | Admitting: Internal Medicine

## 2018-10-26 DIAGNOSIS — Z1231 Encounter for screening mammogram for malignant neoplasm of breast: Secondary | ICD-10-CM

## 2018-11-13 ENCOUNTER — Ambulatory Visit (HOSPITAL_COMMUNITY): Payer: Federal, State, Local not specified - PPO

## 2018-11-14 DIAGNOSIS — M9901 Segmental and somatic dysfunction of cervical region: Secondary | ICD-10-CM | POA: Diagnosis not present

## 2018-11-14 DIAGNOSIS — M542 Cervicalgia: Secondary | ICD-10-CM | POA: Diagnosis not present

## 2018-11-14 DIAGNOSIS — M9902 Segmental and somatic dysfunction of thoracic region: Secondary | ICD-10-CM | POA: Diagnosis not present

## 2018-11-14 DIAGNOSIS — M9903 Segmental and somatic dysfunction of lumbar region: Secondary | ICD-10-CM | POA: Diagnosis not present

## 2018-11-14 DIAGNOSIS — M545 Low back pain: Secondary | ICD-10-CM | POA: Diagnosis not present

## 2018-11-14 DIAGNOSIS — M546 Pain in thoracic spine: Secondary | ICD-10-CM | POA: Diagnosis not present

## 2018-11-19 ENCOUNTER — Ambulatory Visit (HOSPITAL_COMMUNITY)
Admission: RE | Admit: 2018-11-19 | Discharge: 2018-11-19 | Disposition: A | Discharge status: Home / Self Care | Payer: Medicare Other | Source: Ambulatory Visit | Attending: Internal Medicine | Admitting: Internal Medicine

## 2018-11-19 ENCOUNTER — Other Ambulatory Visit: Payer: Self-pay

## 2018-11-19 DIAGNOSIS — Z1231 Encounter for screening mammogram for malignant neoplasm of breast: Secondary | ICD-10-CM | POA: Diagnosis not present

## 2019-02-25 DIAGNOSIS — M9901 Segmental and somatic dysfunction of cervical region: Secondary | ICD-10-CM | POA: Diagnosis not present

## 2019-02-25 DIAGNOSIS — M545 Low back pain: Secondary | ICD-10-CM | POA: Diagnosis not present

## 2019-02-25 DIAGNOSIS — M542 Cervicalgia: Secondary | ICD-10-CM | POA: Diagnosis not present

## 2019-02-25 DIAGNOSIS — M9902 Segmental and somatic dysfunction of thoracic region: Secondary | ICD-10-CM | POA: Diagnosis not present

## 2019-02-25 DIAGNOSIS — M546 Pain in thoracic spine: Secondary | ICD-10-CM | POA: Diagnosis not present

## 2019-02-25 DIAGNOSIS — M9903 Segmental and somatic dysfunction of lumbar region: Secondary | ICD-10-CM | POA: Diagnosis not present

## 2019-04-22 DIAGNOSIS — M9902 Segmental and somatic dysfunction of thoracic region: Secondary | ICD-10-CM | POA: Diagnosis not present

## 2019-04-22 DIAGNOSIS — M542 Cervicalgia: Secondary | ICD-10-CM | POA: Diagnosis not present

## 2019-04-22 DIAGNOSIS — M545 Low back pain: Secondary | ICD-10-CM | POA: Diagnosis not present

## 2019-04-22 DIAGNOSIS — M546 Pain in thoracic spine: Secondary | ICD-10-CM | POA: Diagnosis not present

## 2019-04-22 DIAGNOSIS — M9901 Segmental and somatic dysfunction of cervical region: Secondary | ICD-10-CM | POA: Diagnosis not present

## 2019-04-22 DIAGNOSIS — M9903 Segmental and somatic dysfunction of lumbar region: Secondary | ICD-10-CM | POA: Diagnosis not present

## 2019-06-14 DIAGNOSIS — M9902 Segmental and somatic dysfunction of thoracic region: Secondary | ICD-10-CM | POA: Diagnosis not present

## 2019-06-14 DIAGNOSIS — M542 Cervicalgia: Secondary | ICD-10-CM | POA: Diagnosis not present

## 2019-06-14 DIAGNOSIS — M9903 Segmental and somatic dysfunction of lumbar region: Secondary | ICD-10-CM | POA: Diagnosis not present

## 2019-06-14 DIAGNOSIS — M9901 Segmental and somatic dysfunction of cervical region: Secondary | ICD-10-CM | POA: Diagnosis not present

## 2019-06-14 DIAGNOSIS — M545 Low back pain: Secondary | ICD-10-CM | POA: Diagnosis not present

## 2019-06-14 DIAGNOSIS — M546 Pain in thoracic spine: Secondary | ICD-10-CM | POA: Diagnosis not present

## 2019-07-12 DIAGNOSIS — M9902 Segmental and somatic dysfunction of thoracic region: Secondary | ICD-10-CM | POA: Diagnosis not present

## 2019-07-12 DIAGNOSIS — M545 Low back pain: Secondary | ICD-10-CM | POA: Diagnosis not present

## 2019-07-12 DIAGNOSIS — M9901 Segmental and somatic dysfunction of cervical region: Secondary | ICD-10-CM | POA: Diagnosis not present

## 2019-07-12 DIAGNOSIS — M546 Pain in thoracic spine: Secondary | ICD-10-CM | POA: Diagnosis not present

## 2019-07-12 DIAGNOSIS — M542 Cervicalgia: Secondary | ICD-10-CM | POA: Diagnosis not present

## 2019-07-12 DIAGNOSIS — M9903 Segmental and somatic dysfunction of lumbar region: Secondary | ICD-10-CM | POA: Diagnosis not present

## 2019-07-17 DIAGNOSIS — Z23 Encounter for immunization: Secondary | ICD-10-CM | POA: Diagnosis not present

## 2019-08-14 DIAGNOSIS — Z23 Encounter for immunization: Secondary | ICD-10-CM | POA: Diagnosis not present

## 2019-08-16 ENCOUNTER — Other Ambulatory Visit: Payer: Self-pay

## 2019-08-16 ENCOUNTER — Observation Stay (HOSPITAL_COMMUNITY)
Admission: EM | Admit: 2019-08-16 | Discharge: 2019-08-17 | Disposition: A | Discharge status: Home / Self Care | Payer: Medicare Other | Attending: General Surgery | Admitting: General Surgery

## 2019-08-16 ENCOUNTER — Encounter (HOSPITAL_COMMUNITY)
Admission: EM | Disposition: A | Discharge status: Home / Self Care | Payer: Self-pay | Source: Home / Self Care | Attending: Emergency Medicine

## 2019-08-16 ENCOUNTER — Emergency Department (HOSPITAL_COMMUNITY): Payer: Medicare Other | Admitting: Anesthesiology

## 2019-08-16 ENCOUNTER — Emergency Department (HOSPITAL_COMMUNITY): Payer: Medicare Other

## 2019-08-16 DIAGNOSIS — R109 Unspecified abdominal pain: Secondary | ICD-10-CM | POA: Diagnosis not present

## 2019-08-16 DIAGNOSIS — Z79899 Other long term (current) drug therapy: Secondary | ICD-10-CM | POA: Insufficient documentation

## 2019-08-16 DIAGNOSIS — D649 Anemia, unspecified: Secondary | ICD-10-CM | POA: Diagnosis not present

## 2019-08-16 DIAGNOSIS — E039 Hypothyroidism, unspecified: Secondary | ICD-10-CM | POA: Diagnosis not present

## 2019-08-16 DIAGNOSIS — Z79890 Hormone replacement therapy (postmenopausal): Secondary | ICD-10-CM | POA: Insufficient documentation

## 2019-08-16 DIAGNOSIS — Z20822 Contact with and (suspected) exposure to covid-19: Secondary | ICD-10-CM | POA: Insufficient documentation

## 2019-08-16 DIAGNOSIS — R1084 Generalized abdominal pain: Secondary | ICD-10-CM | POA: Diagnosis not present

## 2019-08-16 DIAGNOSIS — K3580 Unspecified acute appendicitis: Principal | ICD-10-CM | POA: Diagnosis present

## 2019-08-16 DIAGNOSIS — F419 Anxiety disorder, unspecified: Secondary | ICD-10-CM | POA: Insufficient documentation

## 2019-08-16 DIAGNOSIS — Z03818 Encounter for observation for suspected exposure to other biological agents ruled out: Secondary | ICD-10-CM | POA: Diagnosis not present

## 2019-08-16 HISTORY — PX: LAPAROSCOPIC APPENDECTOMY: SHX408

## 2019-08-16 LAB — URINALYSIS, ROUTINE W REFLEX MICROSCOPIC
Bacteria, UA: NONE SEEN
Bilirubin Urine: NEGATIVE
Glucose, UA: 50 mg/dL — AB
Ketones, ur: NEGATIVE mg/dL
Leukocytes,Ua: NEGATIVE
Nitrite: NEGATIVE
Protein, ur: NEGATIVE mg/dL
Specific Gravity, Urine: 1.023 (ref 1.005–1.030)
pH: 6 (ref 5.0–8.0)

## 2019-08-16 LAB — CBC WITH DIFFERENTIAL/PLATELET
Abs Immature Granulocytes: 0.02 10*3/uL (ref 0.00–0.07)
Basophils Absolute: 0 10*3/uL (ref 0.0–0.1)
Basophils Relative: 0 %
Eosinophils Absolute: 0 10*3/uL (ref 0.0–0.5)
Eosinophils Relative: 1 %
HCT: 36.5 % (ref 36.0–46.0)
Hemoglobin: 11.9 g/dL — ABNORMAL LOW (ref 12.0–15.0)
Immature Granulocytes: 0 %
Lymphocytes Relative: 15 %
Lymphs Abs: 1.1 10*3/uL (ref 0.7–4.0)
MCH: 29.4 pg (ref 26.0–34.0)
MCHC: 32.6 g/dL (ref 30.0–36.0)
MCV: 90.1 fL (ref 80.0–100.0)
Monocytes Absolute: 0.6 10*3/uL (ref 0.1–1.0)
Monocytes Relative: 8 %
Neutro Abs: 5.3 10*3/uL (ref 1.7–7.7)
Neutrophils Relative %: 76 %
Platelets: 222 10*3/uL (ref 150–400)
RBC: 4.05 MIL/uL (ref 3.87–5.11)
RDW: 12.8 % (ref 11.5–15.5)
WBC: 7 10*3/uL (ref 4.0–10.5)
nRBC: 0 % (ref 0.0–0.2)

## 2019-08-16 LAB — COMPREHENSIVE METABOLIC PANEL
ALT: 18 U/L (ref 0–44)
AST: 20 U/L (ref 15–41)
Albumin: 3.8 g/dL (ref 3.5–5.0)
Alkaline Phosphatase: 56 U/L (ref 38–126)
Anion gap: 8 (ref 5–15)
BUN: 12 mg/dL (ref 8–23)
CO2: 26 mmol/L (ref 22–32)
Calcium: 8.7 mg/dL — ABNORMAL LOW (ref 8.9–10.3)
Chloride: 103 mmol/L (ref 98–111)
Creatinine, Ser: 0.7 mg/dL (ref 0.44–1.00)
GFR calc Af Amer: >60 mL/min (ref >60)
GFR calc non Af Amer: >60 mL/min (ref >60)
Glucose, Bld: 110 mg/dL — ABNORMAL HIGH (ref 70–99)
Potassium: 3.4 mmol/L — ABNORMAL LOW (ref 3.5–5.1)
Sodium: 137 mmol/L (ref 135–145)
Total Bilirubin: 0.5 mg/dL (ref 0.3–1.2)
Total Protein: 6.7 g/dL (ref 6.5–8.1)

## 2019-08-16 LAB — LIPASE, BLOOD: Lipase: 24 U/L (ref 11–51)

## 2019-08-16 LAB — RESPIRATORY PANEL BY RT PCR (FLU A&B, COVID)
Influenza A by PCR: NEGATIVE
Influenza B by PCR: NEGATIVE
SARS Coronavirus 2 by RT PCR: NEGATIVE

## 2019-08-16 SURGERY — APPENDECTOMY, LAPAROSCOPIC
Anesthesia: General

## 2019-08-16 MED ORDER — DEXAMETHASONE SODIUM PHOSPHATE 4 MG/ML IJ SOLN
INTRAMUSCULAR | Status: DC | PRN
  Administered 2019-08-16: 10 mg via INTRAVENOUS

## 2019-08-16 MED ORDER — ONDANSETRON HCL 4 MG/2ML IJ SOLN
INTRAMUSCULAR | Status: AC
  Filled 2019-08-16: qty 2

## 2019-08-16 MED ORDER — MIDAZOLAM HCL 5 MG/5ML IJ SOLN
INTRAMUSCULAR | Status: DC | PRN
  Administered 2019-08-16: 2 mg via INTRAVENOUS

## 2019-08-16 MED ORDER — MIDAZOLAM HCL 2 MG/2ML IJ SOLN
INTRAMUSCULAR | Status: AC
  Filled 2019-08-16: qty 2

## 2019-08-16 MED ORDER — PROPOFOL 10 MG/ML IV BOLUS
INTRAVENOUS | Status: AC
  Filled 2019-08-16: qty 20

## 2019-08-16 MED ORDER — BUPIVACAINE LIPOSOME 1.3 % IJ SUSP
INTRAMUSCULAR | Status: DC | PRN
  Administered 2019-08-16: 20 mL

## 2019-08-16 MED ORDER — LIDOCAINE 2% (20 MG/ML) 5 ML SYRINGE
INTRAMUSCULAR | Status: AC
  Filled 2019-08-16: qty 5

## 2019-08-16 MED ORDER — SUCCINYLCHOLINE CHLORIDE 200 MG/10ML IV SOSY
PREFILLED_SYRINGE | INTRAVENOUS | Status: AC
  Filled 2019-08-16: qty 10

## 2019-08-16 MED ORDER — DEXAMETHASONE SODIUM PHOSPHATE 10 MG/ML IJ SOLN
INTRAMUSCULAR | Status: AC
  Filled 2019-08-16: qty 1

## 2019-08-16 MED ORDER — LACTATED RINGERS IV SOLN: INTRAVENOUS | Status: DC | PRN

## 2019-08-16 MED ORDER — BUPIVACAINE LIPOSOME 1.3 % IJ SUSP
INTRAMUSCULAR | Status: AC
  Filled 2019-08-16: qty 20

## 2019-08-16 MED ORDER — CHLORHEXIDINE GLUCONATE CLOTH 2 % EX PADS: 6.0000 | MEDICATED_PAD | Freq: Once | CUTANEOUS | Status: DC

## 2019-08-16 MED ORDER — HYDROMORPHONE HCL 1 MG/ML IJ SOLN
0.5000 mg | Freq: Once | INTRAMUSCULAR | Status: AC
  Administered 2019-08-16: 20:00:00 0.5 mg via INTRAVENOUS
  Filled 2019-08-16: qty 1

## 2019-08-16 MED ORDER — HYDROMORPHONE HCL 1 MG/ML IJ SOLN
1.0000 mg | Freq: Once | INTRAMUSCULAR | Status: AC
  Administered 2019-08-16: 21:00:00 1 mg via INTRAVENOUS
  Filled 2019-08-16: qty 1

## 2019-08-16 MED ORDER — LIDOCAINE HCL (CARDIAC) PF 100 MG/5ML IV SOSY
PREFILLED_SYRINGE | INTRAVENOUS | Status: DC | PRN
  Administered 2019-08-16: 60 mg via INTRATRACHEAL

## 2019-08-16 MED ORDER — FENTANYL CITRATE (PF) 250 MCG/5ML IJ SOLN
INTRAMUSCULAR | Status: AC
  Filled 2019-08-16: qty 5

## 2019-08-16 MED ORDER — METOCLOPRAMIDE HCL 5 MG/ML IJ SOLN
INTRAMUSCULAR | Status: DC | PRN
  Administered 2019-08-16: 10 mg via INTRAVENOUS

## 2019-08-16 MED ORDER — ONDANSETRON HCL 4 MG/2ML IJ SOLN
4.0000 mg | Freq: Once | INTRAMUSCULAR | Status: AC
  Administered 2019-08-16: 22:00:00 4 mg via INTRAVENOUS
  Filled 2019-08-16: qty 2

## 2019-08-16 MED ORDER — SODIUM CHLORIDE 0.9 % IV SOLN
2.0000 g | Freq: Once | INTRAVENOUS | Status: AC
  Administered 2019-08-16: 21:00:00 2 g via INTRAVENOUS
  Filled 2019-08-16: qty 20

## 2019-08-16 MED ORDER — METRONIDAZOLE IN NACL 5-0.79 MG/ML-% IV SOLN
500.0000 mg | Freq: Once | INTRAVENOUS | Status: AC
  Administered 2019-08-16: 21:00:00 500 mg via INTRAVENOUS
  Filled 2019-08-16: qty 100

## 2019-08-16 MED ORDER — PROPOFOL 10 MG/ML IV BOLUS
INTRAVENOUS | Status: DC | PRN
  Administered 2019-08-16: 100 mg via INTRAVENOUS

## 2019-08-16 MED ORDER — FENTANYL CITRATE (PF) 100 MCG/2ML IJ SOLN
INTRAMUSCULAR | Status: DC | PRN
  Administered 2019-08-16 (×2): 50 ug via INTRAVENOUS

## 2019-08-16 MED ORDER — SODIUM CHLORIDE 0.9 % IR SOLN
Status: DC | PRN
  Administered 2019-08-16: 1000 mL

## 2019-08-16 MED ORDER — SUCCINYLCHOLINE CHLORIDE 200 MG/10ML IV SOSY
PREFILLED_SYRINGE | INTRAVENOUS | Status: DC | PRN
  Administered 2019-08-16: 100 mg via INTRAVENOUS

## 2019-08-16 MED ORDER — ONDANSETRON HCL 4 MG/2ML IJ SOLN
4.0000 mg | Freq: Once | INTRAMUSCULAR | Status: AC
  Administered 2019-08-16: 20:00:00 4 mg via INTRAVENOUS
  Filled 2019-08-16: qty 2

## 2019-08-16 MED ORDER — ROCURONIUM BROMIDE 10 MG/ML (PF) SYRINGE
PREFILLED_SYRINGE | INTRAVENOUS | Status: AC
  Filled 2019-08-16: qty 10

## 2019-08-16 SURGICAL SUPPLY — 45 items
BAG RETRIEVAL 10 (BASKET) ×1
BAG RETRIEVAL 10MM (BASKET) ×1
CHLORAPREP W/TINT 26 (MISCELLANEOUS) ×3 IMPLANT
CLOTH BEACON ORANGE TIMEOUT ST (SAFETY) ×3 IMPLANT
COVER LIGHT HANDLE STERIS (MISCELLANEOUS) ×6 IMPLANT
COVER WAND RF STERILE (DRAPES) ×3 IMPLANT
CUTTER FLEX LINEAR 45M (STAPLE) ×3 IMPLANT
DERMABOND ADVANCED (GAUZE/BANDAGES/DRESSINGS) ×2
DERMABOND ADVANCED .7 DNX12 (GAUZE/BANDAGES/DRESSINGS) ×1 IMPLANT
ELECT REM PT RETURN 9FT ADLT (ELECTROSURGICAL) ×3
ELECTRODE REM PT RTRN 9FT ADLT (ELECTROSURGICAL) ×1 IMPLANT
GLOVE BIOGEL M 6.5 STRL (GLOVE) ×3 IMPLANT
GLOVE BIOGEL PI IND STRL 7.0 (GLOVE) ×3 IMPLANT
GLOVE BIOGEL PI INDICATOR 7.0 (GLOVE) ×6
GLOVE ECLIPSE 6.5 STRL STRAW (GLOVE) ×3 IMPLANT
GLOVE ECLIPSE 7.0 STRL STRAW (GLOVE) ×3 IMPLANT
GLOVE SURG SS PI 7.5 STRL IVOR (GLOVE) ×3 IMPLANT
GOWN STRL REUS W/TWL LRG LVL3 (GOWN DISPOSABLE) ×6 IMPLANT
INST SET LAPROSCOPIC AP (KITS) ×3 IMPLANT
KIT TURNOVER KIT A (KITS) ×3 IMPLANT
MANIFOLD NEPTUNE II (INSTRUMENTS) ×3 IMPLANT
NEEDLE HYPO 18GX1.5 BLUNT FILL (NEEDLE) ×3 IMPLANT
NEEDLE HYPO 22GX1.5 SAFETY (NEEDLE) ×3 IMPLANT
NEEDLE INSUFFLATION 14GA 120MM (NEEDLE) ×3 IMPLANT
NS IRRIG 1000ML POUR BTL (IV SOLUTION) ×3 IMPLANT
PACK LAP CHOLE LZT030E (CUSTOM PROCEDURE TRAY) ×3 IMPLANT
PAD ARMBOARD 7.5X6 YLW CONV (MISCELLANEOUS) ×3 IMPLANT
PENCIL SMOKE EVACUATOR COATED (MISCELLANEOUS) ×3 IMPLANT
RELOAD STAPLE TA45 3.5 REG BLU (ENDOMECHANICALS) ×3 IMPLANT
SET BASIN LINEN APH (SET/KITS/TRAYS/PACK) ×3 IMPLANT
SET TUBE SMOKE EVAC HIGH FLOW (TUBING) ×3 IMPLANT
SHEARS HARMONIC ACE PLUS 36CM (ENDOMECHANICALS) ×3 IMPLANT
SUT MNCRL AB 4-0 PS2 18 (SUTURE) ×6 IMPLANT
SUT VICRYL 0 UR6 27IN ABS (SUTURE) ×3 IMPLANT
SYR 20ML LL LF (SYRINGE) ×6 IMPLANT
SYS BAG RETRIEVAL 10MM (BASKET) ×1
SYSTEM BAG RETRIEVAL 10MM (BASKET) ×1 IMPLANT
TRAY FOLEY W/BAG SLVR 16FR (SET/KITS/TRAYS/PACK) ×2
TRAY FOLEY W/BAG SLVR 16FR ST (SET/KITS/TRAYS/PACK) ×1 IMPLANT
TROCAR ENDO BLADELESS 11MM (ENDOMECHANICALS) ×3 IMPLANT
TROCAR ENDO BLADELESS 12MM (ENDOMECHANICALS) ×3 IMPLANT
TROCAR XCEL NON-BLD 5MMX100MML (ENDOMECHANICALS) ×3 IMPLANT
TUBE CONNECTING 12'X1/4 (SUCTIONS) ×1
TUBE CONNECTING 12X1/4 (SUCTIONS) ×2 IMPLANT
WARMER LAPAROSCOPE (MISCELLANEOUS) ×3 IMPLANT

## 2019-08-16 NOTE — Anesthesia Preprocedure Evaluation (Signed)
Anesthesia Evaluation  Patient identified by MRN, date of birth, ID band Patient awake    Reviewed: Allergy & Precautions, NPO status , Patient's Chart, lab work & pertinent test results  History of Anesthesia Complications (+) PONVNegative for: history of anesthetic complications  Airway Mallampati: II  TM Distance: >3 FB Neck ROM: Full    Dental no notable dental hx. (+) Teeth Intact, Dental Advisory Given   Pulmonary neg pulmonary ROS,    Pulmonary exam normal breath sounds clear to auscultation       Cardiovascular Exercise Tolerance: Good negative cardio ROS Normal cardiovascular exam Rhythm:Regular Rate:Normal - Systolic murmurs, - Diastolic murmurs, - Friction Rub, - Carotid Bruit, - Peripheral Edema and - Systolic Click [id number] [facility] ROUTINE RECORD Sinus rhythm Borderline intraventricular conduction delay Low voltage, precordial leads Borderline T abnormalities, anterior leads   Neuro/Psych Anxiety negative neurological ROS     GI/Hepatic negative GI ROS, Neg liver ROS,   Endo/Other  Hypothyroidism   Renal/GU negative Renal ROS     Musculoskeletal   Abdominal   Peds  Hematology  (+) anemia ,   Anesthesia Other Findings   Reproductive/Obstetrics                             Anesthesia Physical Anesthesia Plan  ASA: II and emergent  Anesthesia Plan: General   Post-op Pain Management:    Induction: Intravenous  PONV Risk Score and Plan: 4 or greater and Dexamethasone, Midazolam, Metaclopromide and Ondansetron  Airway Management Planned: Oral ETT  Additional Equipment:   Intra-op Plan:   Post-operative Plan: Extubation in OR  Informed Consent: I have reviewed the patients History and Physical, chart, labs and discussed the procedure including the risks, benefits and alternatives for the proposed anesthesia with the patient or  authorized representative who has indicated his/her understanding and acceptance.     Dental advisory given  Plan Discussed with: Surgeon  Anesthesia Plan Comments:         Anesthesia Quick Evaluation

## 2019-08-16 NOTE — Consult Note (Signed)
Reason for Consult: Acute appendicitis Referring Physician: Dr. Samuella Bruin is an 69 y.o. female.  HPI: Patient is a 69 year old white female who presented to the emergency room with a 24-hour history of right-sided abdominal pain.  CT scan of the abdomen revealed acute appendicitis with a dilated appendix and multiple appendicoliths present.  There was no evidence of perforation.  Patient states this is her first episode.  She currently has a level of 4 out of 10 pain.  She denies any diarrhea.  She last had a colonoscopy in 2019.  Past Medical History:  Diagnosis Date  . Anemia   . Anxiety   . Hemorrhoids 2015: [place] x3  . Hypothyroidism AGE 32    Past Surgical History:  Procedure Laterality Date  . BREAST SURGERY Right   . COLONOSCOPY  2007   [facility]   . COLONOSCOPY N/A 07/14/2017   Procedure: COLONOSCOPY;  Surgeon: Danie Binder, MD;  [facility];  Service: Endoscopy;  Laterality: N/A;  2:45pm  . FLEXIBLE SIGMOIDOSCOPY N/A 06/10/2014   SLF: Rectal bleeding/pain due to rectal ulcer caused by hemorrhoid banding. 2. One Hemorrhoid band still in place without evidence of active bleeding  . HEMORRHOID BANDING N/A 07/14/2017   Procedure: Thayer Jew;  Surgeon: Danie Binder, MD;  [facility];  Service: Endoscopy;  Laterality: N/A;  . HEMORRHOID SURGERY N/A 06/23/2014   Procedure: EXTENSIVE HEMORRHOIDECTOMY;  Surgeon: Jamesetta So, MD;  Location: [facility];  Service: General;  Laterality: N/A;  . STAPEDECTOMY Right     Family History  Problem Relation Age of Onset  . Colon cancer Neg Hx   . Colon polyps Neg Hx     Social History:  reports that she has never smoked. She has never used smokeless tobacco. She reports current alcohol use. She reports that she does not use drugs.  Allergies:  Allergies  Allergen Reactions  . Codeine Nausea And Vomiting  . Doxycycline Nausea And Vomiting  . Sulfa Antibiotics Rash    Medications: I have reviewed the  patient's current medications.  Results for orders placed or performed during the hospital encounter of 08/16/19 (from the past 48 hour(s))  Urinalysis, Routine w reflex microscopic     Status: Abnormal   Collection Time: 08/16/19  6:02 PM  Result Value Ref Range   Color, Urine YELLOW YELLOW   APPearance HAZY (A) CLEAR   Specific Gravity, Urine 1.023 1.005 - 1.030   pH 6.0 5.0 - 8.0   Glucose, UA 50 (A) NEGATIVE mg/dL   Hgb urine dipstick MODERATE (A) NEGATIVE   Bilirubin Urine NEGATIVE NEGATIVE   Ketones, ur NEGATIVE NEGATIVE mg/dL   Protein, ur NEGATIVE NEGATIVE mg/dL   Nitrite NEGATIVE NEGATIVE   Leukocytes,Ua NEGATIVE NEGATIVE   RBC / HPF 6-10 0 - 5 RBC/hpf   WBC, UA 0-5 0 - 5 WBC/hpf   Bacteria, UA NONE SEEN NONE SEEN   Squamous Epithelial / LPF 0-5 0 - 5   Mucus PRESENT     Comment: Performed at [facility], [address] [place], [place] [address]  CBC with Differential/Platelet     Status: Abnormal   Collection Time: 08/16/19  6:30 PM  Result Value Ref Range   WBC 7.0 4.0 - 10.5 K/uL   RBC 4.05 3.87 - 5.11 MIL/uL   Hemoglobin 11.9 (L) 12.0 - 15.0 g/dL   HCT 36.5 36.0 - 46.0 %   MCV 90.1 80.0 - 100.0 fL   MCH 29.4  26.0 - 34.0 pg   MCHC 32.6 30.0 - 36.0 g/dL   RDW 12.8 11.5 - 15.5 %   Platelets 222 150 - 400 K/uL   nRBC 0.0 0.0 - 0.2 %   Neutrophils Relative % 76 %   Neutro Abs 5.3 1.7 - 7.7 K/uL   Lymphocytes Relative 15 %   Lymphs Abs 1.1 0.7 - 4.0 K/uL   Monocytes Relative 8 %   Monocytes Absolute 0.6 0.1 - 1.0 K/uL   Eosinophils Relative 1 %   Eosinophils Absolute 0.0 0.0 - 0.5 K/uL   Basophils Relative 0 %   Basophils Absolute 0.0 0.0 - 0.1 K/uL   Immature Granulocytes 0 %   Abs Immature Granulocytes 0.02 0.00 - 0.07 K/uL    Comment: Performed at [facility], [address] [place], [place] [address]  Comprehensive metabolic panel     Status: Abnormal   Collection Time: 08/16/19  6:30 PM  Result Value Ref Range   Sodium 137 135 - 145 mmol/L    Potassium 3.4 (L) 3.5 - 5.1 mmol/L   Chloride 103 98 - 111 mmol/L   CO2 26 22 - 32 mmol/L   Glucose, Bld 110 (H) 70 - 99 mg/dL    Comment: Glucose reference range applies only to samples taken after fasting for at least 8 hours.   BUN 12 8 - 23 mg/dL   Creatinine, Ser 0.70 0.44 - 1.00 mg/dL   Calcium 8.7 (L) 8.9 - 10.3 mg/dL   Total Protein 6.7 6.5 - 8.1 g/dL   Albumin 3.8 3.5 - 5.0 g/dL   AST 20 15 - 41 U/L   ALT 18 0 - 44 U/L   Alkaline Phosphatase 56 38 - 126 U/L   Total Bilirubin 0.5 0.3 - 1.2 mg/dL   GFR calc non Af Amer >60 >60 mL/min   GFR calc Af Amer >60 >60 mL/min   Anion gap 8 5 - 15    Comment: Performed at [facility], [address] [place], [place] [address]  Lipase, blood     Status: None   Collection Time: 08/16/19  6:30 PM  Result Value Ref Range   Lipase 24 11 - 51 U/L    Comment: Performed at [facility], [address] [place], [place] [address]  Respiratory Panel by RT PCR (Flu A&B, Covid) - Nasopharyngeal Swab     Status: None   Collection Time: 08/16/19  8:26 PM   Specimen: Nasopharyngeal Swab  Result Value Ref Range   SARS Coronavirus 2 by RT PCR NEGATIVE NEGATIVE    Comment: (NOTE) SARS-CoV-2 target nucleic acids are NOT DETECTED. The SARS-CoV-2 RNA is generally detectable in upper respiratoy specimens during the acute phase of infection. The lowest concentration of SARS-CoV-2 viral copies this assay can detect is 131 copies/mL. A negative result does not preclude SARS-Cov-2 infection and should not be used as the sole basis for treatment or other patient management decisions. A negative result may occur with  improper specimen collection/handling, submission of specimen other than nasopharyngeal swab, presence of viral mutation(s) within the areas targeted by this assay, and inadequate number of viral copies (<131 copies/mL). A negative result must be combined with clinical observations, patient history, and epidemiological information.  The expected result is Negative. Fact Sheet for Patients:  PinkCheek.be Fact Sheet for Healthcare Providers:  GravelBags.it This test is not yet ap proved or cleared by the [place] FDA and  has been authorized for detection and/or diagnosis of SARS-CoV-2 by  FDA under an Emergency Use Authorization (EUA). This EUA will remain  in effect (meaning this test can be used) for the duration of the COVID-19 declaration under Section 564(b)(1) of the Act, 21 U.S.C. section 360bbb-3(b)(1), unless the authorization is terminated or revoked sooner.    Influenza A by PCR NEGATIVE NEGATIVE   Influenza B by PCR NEGATIVE NEGATIVE    Comment: (NOTE) The Xpert Xpress SARS-CoV-2/FLU/RSV assay is intended as an aid in  the diagnosis of influenza from Nasopharyngeal swab specimens and  should not be used as a sole basis for treatment. Nasal washings and  aspirates are unacceptable for Xpert Xpress SARS-CoV-2/FLU/RSV  testing. Fact Sheet for Patients: PinkCheek.be Fact Sheet for Healthcare Providers: GravelBags.it This test is not yet approved or cleared by the [place] FDA and  has been authorized for detection and/or diagnosis of SARS-CoV-2 by  FDA under an Emergency Use Authorization (EUA). This EUA will remain  in effect (meaning this test can be used) for the duration of the  Covid-19 declaration under Section 564(b)(1) of the Act, 21  U.S.C. section 360bbb-3(b)(1), unless the authorization is  terminated or revoked. Performed at [facility], [address] [place], [place] [address]     CT Renal Joaquim Lai Study  Result Date: 08/16/2019 CLINICAL DATA:  Abdominal pain EXAM: CT ABDOMEN AND PELVIS WITHOUT CONTRAST TECHNIQUE: Multidetector CT imaging of the abdomen and pelvis was performed following the standard protocol without IV contrast. COMPARISON:  None. FINDINGS: Lower  chest: No acute abnormality. Hepatobiliary: No focal liver abnormality is seen. No gallstones, gallbladder wall thickening, or biliary dilatation. Pancreas: Unremarkable. No pancreatic ductal dilatation or surrounding inflammatory changes. Spleen: Normal in size without focal abnormality. Adrenals/Urinary Tract: Adrenal glands are within normal limits. No renal calculi or obstructive changes are noted. No ureteral stones are seen. The bladder is partially distended. Stomach/Bowel: Colon is well visualized and a focal lipoma is noted in the ascending colon. No obstructive or inflammatory changes of the colon are seen. Small bowel and stomach are unremarkable. There are changes consistent with acute appendicitis. Appendix: Location: Medial to the cecum Diameter: 12 mm Appendicolith: Appendicoliths are noted centrally. Mucosal hyper-enhancement: Present Extraluminal gas: Absent Periappendiceal collection: No fluid collection is noted although periappendiceal inflammatory changes are seen. Vascular/Lymphatic: No significant vascular findings are present. No enlarged abdominal or pelvic lymph nodes. Reproductive: Uterus is within normal limits. Cystic changes are noted on the right measuring up to 2 cm. Other: Minimal free fluid is noted. Musculoskeletal: No acute or significant osseous findings. IMPRESSION: Changes consistent with acute appendicitis as described above. Electronically Signed   By: Inez Catalina M.D.   On: 08/16/2019 20:10    ROS:  Pertinent items are noted in HPI.  Blood pressure 112/60, pulse (!) 57, temperature 99.2 F (37.3 C), temperature source Oral, resp. rate 16, height 5\' 4"  (1.626 m), weight 59 kg, SpO2 92 %. Physical Exam: Pleasant well-developed well-nourished white female no acute distress Head is normocephalic, atraumatic Lungs clear to auscultation with equal breath sounds bilaterally Heart examination reveals regular rate and rhythm without S3, S4, murmurs Abdomen soft with  tenderness noted to palpation in the right lower quadrant.  No rigidity is noted.  CT scan images personally reviewed  Assessment/Plan: Impression: Acute appendicitis Plan: Patient be taken to the operating room for laparoscopic appendectomy.  The risks and benefits of the procedure including bleeding, infection, and the possibility of an open procedure were fully explained to the patient, who gave informed consent.  Elta Guadeloupe  Nakea Gouger 08/16/2019, 10:39 PM

## 2019-08-16 NOTE — H&P (View-Only) (Signed)
Reason for Consult: Acute appendicitis Referring Physician: Dr. Samuella Bruin is an 69 y.o. female.  HPI: Patient is a 69 year old white female who presented to the emergency room with a 24-hour history of right-sided abdominal pain.  CT scan of the abdomen revealed acute appendicitis with a dilated appendix and multiple appendicoliths present.  There was no evidence of perforation.  Patient states this is her first episode.  She currently has a level of 4 out of 10 pain.  She denies any diarrhea.  She last had a colonoscopy in 2019.  Past Medical History:  Diagnosis Date  . Anemia   . Anxiety   . Hemorrhoids 2015: [place] x3  . Hypothyroidism AGE 26    Past Surgical History:  Procedure Laterality Date  . BREAST SURGERY Right   . COLONOSCOPY  2007   [facility]   . COLONOSCOPY N/A 07/14/2017   Procedure: COLONOSCOPY;  Surgeon: Danie Binder, MD;  [facility];  Service: Endoscopy;  Laterality: N/A;  2:45pm  . FLEXIBLE SIGMOIDOSCOPY N/A 06/10/2014   SLF: Rectal bleeding/pain due to rectal ulcer caused by hemorrhoid banding. 2. One Hemorrhoid band still in place without evidence of active bleeding  . HEMORRHOID BANDING N/A 07/14/2017   Procedure: Thayer Jew;  Surgeon: Danie Binder, MD;  [facility];  Service: Endoscopy;  Laterality: N/A;  . HEMORRHOID SURGERY N/A 06/23/2014   Procedure: EXTENSIVE HEMORRHOIDECTOMY;  Surgeon: Jamesetta So, MD;  Location: [facility];  Service: General;  Laterality: N/A;  . STAPEDECTOMY Right     Family History  Problem Relation Age of Onset  . Colon cancer Neg Hx   . Colon polyps Neg Hx     Social History:  reports that she has never smoked. She has never used smokeless tobacco. She reports current alcohol use. She reports that she does not use drugs.  Allergies:  Allergies  Allergen Reactions  . Codeine Nausea And Vomiting  . Doxycycline Nausea And Vomiting  . Sulfa Antibiotics Rash    Medications: I have reviewed the  patient's current medications.  Results for orders placed or performed during the hospital encounter of 08/16/19 (from the past 48 hour(s))  Urinalysis, Routine w reflex microscopic     Status: Abnormal   Collection Time: 08/16/19  6:02 PM  Result Value Ref Range   Color, Urine YELLOW YELLOW   APPearance HAZY (A) CLEAR   Specific Gravity, Urine 1.023 1.005 - 1.030   pH 6.0 5.0 - 8.0   Glucose, UA 50 (A) NEGATIVE mg/dL   Hgb urine dipstick MODERATE (A) NEGATIVE   Bilirubin Urine NEGATIVE NEGATIVE   Ketones, ur NEGATIVE NEGATIVE mg/dL   Protein, ur NEGATIVE NEGATIVE mg/dL   Nitrite NEGATIVE NEGATIVE   Leukocytes,Ua NEGATIVE NEGATIVE   RBC / HPF 6-10 0 - 5 RBC/hpf   WBC, UA 0-5 0 - 5 WBC/hpf   Bacteria, UA NONE SEEN NONE SEEN   Squamous Epithelial / LPF 0-5 0 - 5   Mucus PRESENT     Comment: Performed at [facility], [address] [place], [place] [address]  CBC with Differential/Platelet     Status: Abnormal   Collection Time: 08/16/19  6:30 PM  Result Value Ref Range   WBC 7.0 4.0 - 10.5 K/uL   RBC 4.05 3.87 - 5.11 MIL/uL   Hemoglobin 11.9 (L) 12.0 - 15.0 g/dL   HCT 36.5 36.0 - 46.0 %   MCV 90.1 80.0 - 100.0 fL   MCH 29.4  26.0 - 34.0 pg   MCHC 32.6 30.0 - 36.0 g/dL   RDW 12.8 11.5 - 15.5 %   Platelets 222 150 - 400 K/uL   nRBC 0.0 0.0 - 0.2 %   Neutrophils Relative % 76 %   Neutro Abs 5.3 1.7 - 7.7 K/uL   Lymphocytes Relative 15 %   Lymphs Abs 1.1 0.7 - 4.0 K/uL   Monocytes Relative 8 %   Monocytes Absolute 0.6 0.1 - 1.0 K/uL   Eosinophils Relative 1 %   Eosinophils Absolute 0.0 0.0 - 0.5 K/uL   Basophils Relative 0 %   Basophils Absolute 0.0 0.0 - 0.1 K/uL   Immature Granulocytes 0 %   Abs Immature Granulocytes 0.02 0.00 - 0.07 K/uL    Comment: Performed at [facility], [address], [place], [place] [address]  Comprehensive metabolic panel     Status: Abnormal   Collection Time: 08/16/19  6:30 PM  Result Value Ref Range   Sodium 137 135 - 145 mmol/L    Potassium 3.4 (L) 3.5 - 5.1 mmol/L   Chloride 103 98 - 111 mmol/L   CO2 26 22 - 32 mmol/L   Glucose, Bld 110 (H) 70 - 99 mg/dL    Comment: Glucose reference range applies only to samples taken after fasting for at least 8 hours.   BUN 12 8 - 23 mg/dL   Creatinine, Ser 0.70 0.44 - 1.00 mg/dL   Calcium 8.7 (L) 8.9 - 10.3 mg/dL   Total Protein 6.7 6.5 - 8.1 g/dL   Albumin 3.8 3.5 - 5.0 g/dL   AST 20 15 - 41 U/L   ALT 18 0 - 44 U/L   Alkaline Phosphatase 56 38 - 126 U/L   Total Bilirubin 0.5 0.3 - 1.2 mg/dL   GFR calc non Af Amer >60 >60 mL/min   GFR calc Af Amer >60 >60 mL/min   Anion gap 8 5 - 15    Comment: Performed at [facility], [address], [place], [place] [address]  Lipase, blood     Status: None   Collection Time: 08/16/19  6:30 PM  Result Value Ref Range   Lipase 24 11 - 51 U/L    Comment: Performed at [facility], [address] [place],  [address]  Respiratory Panel by RT PCR (Flu A&B, Covid) - Nasopharyngeal Swab     Status: None   Collection Time: 08/16/19  8:26 PM   Specimen: Nasopharyngeal Swab  Result Value Ref Range   SARS Coronavirus 2 by RT PCR NEGATIVE NEGATIVE    Comment: (NOTE) SARS-CoV-2 target nucleic acids are NOT DETECTED. The SARS-CoV-2 RNA is generally detectable in upper respiratoy specimens during the acute phase of infection. The lowest concentration of SARS-CoV-2 viral copies this assay can detect is 131 copies/mL. A negative result does not preclude SARS-Cov-2 infection and should not be used as the sole basis for treatment or other patient management decisions. A negative result may occur with  improper specimen collection/handling, submission of specimen other than nasopharyngeal swab, presence of viral mutation(s) within the areas targeted by this assay, and inadequate number of viral copies (<131 copies/mL). A negative result must be combined with clinical observations, patient history, and epidemiological information.  The expected result is Negative. Fact Sheet for Patients:  PinkCheek.be Fact Sheet for Healthcare Providers:  GravelBags.it This test is not yet ap proved or cleared by the [place] FDA and  has been authorized for detection and/or diagnosis of SARS-CoV-2 by  FDA under an Emergency Use Authorization (EUA). This EUA will remain  in effect (meaning this test can be used) for the duration of the COVID-19 declaration under Section 564(b)(1) of the Act, 21 U.S.C. section 360bbb-3(b)(1), unless the authorization is terminated or revoked sooner.    Influenza A by PCR NEGATIVE NEGATIVE   Influenza B by PCR NEGATIVE NEGATIVE    Comment: (NOTE) The Xpert Xpress SARS-CoV-2/FLU/RSV assay is intended as an aid in  the diagnosis of influenza from Nasopharyngeal swab specimens and  should not be used as a sole basis for treatment. Nasal washings and  aspirates are unacceptable for Xpert Xpress SARS-CoV-2/FLU/RSV  testing. Fact Sheet for Patients: PinkCheek.be Fact Sheet for Healthcare Providers: GravelBags.it This test is not yet approved or cleared by the [place] FDA and  has been authorized for detection and/or diagnosis of SARS-CoV-2 by  FDA under an Emergency Use Authorization (EUA). This EUA will remain  in effect (meaning this test can be used) for the duration of the  Covid-19 declaration under Section 564(b)(1) of the Act, 21  U.S.C. section 360bbb-3(b)(1), unless the authorization is  terminated or revoked. Performed at [facility], [address], [place], [place] [address]     CT Renal Joaquim Lai Study  Result Date: 08/16/2019 CLINICAL DATA:  Abdominal pain EXAM: CT ABDOMEN AND PELVIS WITHOUT CONTRAST TECHNIQUE: Multidetector CT imaging of the abdomen and pelvis was performed following the standard protocol without IV contrast. COMPARISON:  None. FINDINGS: Lower  chest: No acute abnormality. Hepatobiliary: No focal liver abnormality is seen. No gallstones, gallbladder wall thickening, or biliary dilatation. Pancreas: Unremarkable. No pancreatic ductal dilatation or surrounding inflammatory changes. Spleen: Normal in size without focal abnormality. Adrenals/Urinary Tract: Adrenal glands are within normal limits. No renal calculi or obstructive changes are noted. No ureteral stones are seen. The bladder is partially distended. Stomach/Bowel: Colon is well visualized and a focal lipoma is noted in the ascending colon. No obstructive or inflammatory changes of the colon are seen. Small bowel and stomach are unremarkable. There are changes consistent with acute appendicitis. Appendix: Location: Medial to the cecum Diameter: 12 mm Appendicolith: Appendicoliths are noted centrally. Mucosal hyper-enhancement: Present Extraluminal gas: Absent Periappendiceal collection: No fluid collection is noted although periappendiceal inflammatory changes are seen. Vascular/Lymphatic: No significant vascular findings are present. No enlarged abdominal or pelvic lymph nodes. Reproductive: Uterus is within normal limits. Cystic changes are noted on the right measuring up to 2 cm. Other: Minimal free fluid is noted. Musculoskeletal: No acute or significant osseous findings. IMPRESSION: Changes consistent with acute appendicitis as described above. Electronically Signed   By: Inez Catalina M.D.   On: 08/16/2019 20:10    ROS:  Pertinent items are noted in HPI.  Blood pressure 112/60, pulse (!) 57, temperature 99.2 F (37.3 C), temperature source Oral, resp. rate 16, height 5\' 4"  (1.626 m), weight 59 kg, SpO2 92 %. Physical Exam: Pleasant well-developed well-nourished white female no acute distress Head is normocephalic, atraumatic Lungs clear to auscultation with equal breath sounds bilaterally Heart examination reveals regular rate and rhythm without S3, S4, murmurs Abdomen soft with  tenderness noted to palpation in the right lower quadrant.  No rigidity is noted.  CT scan images personally reviewed  Assessment/Plan: Impression: Acute appendicitis Plan: Patient be taken to the operating room for laparoscopic appendectomy.  The risks and benefits of the procedure including bleeding, infection, and the possibility of an open procedure were fully explained to the patient, who gave informed consent.  Elta Guadeloupe  Les Longmore 08/16/2019, 10:39 PM

## 2019-08-16 NOTE — ED Provider Notes (Addendum)
[facility] EMERGENCY DEPARTMENT Provider Note   CSN: [id number] Arrival date & time: 08/16/19  1705     History Chief Complaint  Patient presents with  . Abdominal Pain    Stacey Tucker is a 69 y.o. female.  The history is provided by the patient. No language interpreter was used.  Abdominal Pain Pain location:  Generalized Pain quality: aching   Pain radiates to:  Does not radiate Pain severity:  Moderate Onset quality:  Gradual Timing:  Constant Progression:  Worsening Chronicity:  New Context: not recent illness   Relieved by:  Nothing Worsened by:  Nothing Ineffective treatments:  None tried Associated symptoms: no anorexia and no fever   Risk factors: pregnancy   Risk factors: no alcohol abuse and has not had multiple surgeries    Pt reports she had a covid shot 2 days ago.  Pt reports she has had abdominal pain for 2 days.      Past Medical History:  Diagnosis Date  . Anemia   . Anxiety   . Hemorrhoids 2015: [place] x3  . Hypothyroidism AGE 57    Patient Active Problem List   Diagnosis Date Noted  . Rectal bleeding   . Lipoma of colon   . Hemorrhoids, internal, with bleeding   . Hypothyroidism   . Anxiety   . Syncope 07/01/2014  . Internal hemorrhoids with complication [id number]    Past Surgical History:  Procedure Laterality Date  . BREAST SURGERY Right   . COLONOSCOPY  2007   [facility]   . COLONOSCOPY N/A 07/14/2017   Procedure: COLONOSCOPY;  Surgeon: Danie Binder, MD;  Location: [facility];  Service: Endoscopy;  Laterality: N/A;  2:45pm  . FLEXIBLE SIGMOIDOSCOPY N/A 06/10/2014   SLF: Rectal bleeding/pain due to rectal ulcer caused by hemorrhoid banding. 2. One Hemorrhoid band still in place without evidence of active bleeding  . HEMORRHOID BANDING N/A 07/14/2017   Procedure: Thayer Jew;  Surgeon: Danie Binder, MD;  Location: [facility];  Service: Endoscopy;  Laterality: N/A;  . HEMORRHOID SURGERY N/A 06/23/2014   Procedure: EXTENSIVE  HEMORRHOIDECTOMY;  Surgeon: Jamesetta So, MD;  Location: [facility] ORS;  Service: General;  Laterality: N/A;  . STAPEDECTOMY Right      OB History   No obstetric history on file.     Family History  Problem Relation Age of Onset  . Colon cancer Neg Hx   . Colon polyps Neg Hx     Social History   Tobacco Use  . Smoking status: Never Smoker  . Smokeless tobacco: Never Used  Substance Use Topics  . Alcohol use: Yes    Comment: wine 2-3 times weekly  . Drug use: No    Home Medications Prior to Admission medications   Medication Sig Start Date End Date Taking? Authorizing Provider  ibuprofen (ADVIL,MOTRIN) 200 MG tablet Take 400 mg by mouth every 8 (eight) hours as needed for headache or moderate pain.   Yes [provider]  KRILL OIL PO Take 2 tablets by mouth daily.    Yes [provider]  levothyroxine (SYNTHROID, LEVOTHROID) 100 MCG tablet Take 100 mcg by mouth daily before breakfast.  04/27/13  Yes [provider]  Multiple Vitamin (MULTIVITAMIN) capsule Take 1 capsule by mouth daily.    Yes [provider]  venlafaxine XR (EFFEXOR-XR) 75 MG 24 hr capsule Take 75 mg by mouth daily with breakfast.  05/21/13  Yes [provider]    Allergies  Codeine, Doxycycline, and Sulfa antibiotics  Review of Systems   Review of Systems  Constitutional: Negative for fever.  Gastrointestinal: Positive for abdominal pain. Negative for anorexia.  All other systems reviewed and are negative.   Physical Exam Updated Vital Signs BP 130/68 (BP Location: Left Arm)   Pulse 70   Temp 99.2 F (37.3 C) (Oral)   Resp 14   Ht 5\' 4"  (1.626 m)   Wt 59 kg   SpO2 99%   BMI 22.31 kg/m   Physical Exam Vitals reviewed.  HENT:     Head: Normocephalic.  Cardiovascular:     Rate and Rhythm: Normal rate.  Pulmonary:     Effort: Pulmonary effort is normal.  Abdominal:     General: Abdomen is flat. Bowel sounds are normal.     Palpations: Abdomen is  soft.     Tenderness: There is generalized abdominal tenderness and tenderness in the right upper quadrant and right lower quadrant.  Skin:    General: Skin is warm.  Neurological:     General: No focal deficit present.     Mental Status: She is alert.  Psychiatric:        Mood and Affect: Mood normal.     ED Results / Procedures / Treatments   Labs (all labs ordered are listed, but only abnormal results are displayed) Labs Reviewed  CBC WITH DIFFERENTIAL/PLATELET - Abnormal; Notable for the following components:      Result Value   Hemoglobin 11.9 (*)    All other components within normal limits  COMPREHENSIVE METABOLIC PANEL - Abnormal; Notable for the following components:   Potassium 3.4 (*)    Glucose, Bld 110 (*)    Calcium 8.7 (*)    All other components within normal limits  URINALYSIS, ROUTINE W REFLEX MICROSCOPIC - Abnormal; Notable for the following components:   APPearance HAZY (*)    Glucose, UA 50 (*)    Hgb urine dipstick MODERATE (*)    All other components within normal limits  LIPASE, BLOOD    EKG None  Radiology CT Renal Stone Study  Result Date: 08/16/2019 CLINICAL DATA:  Abdominal pain EXAM: CT ABDOMEN AND PELVIS WITHOUT CONTRAST TECHNIQUE: Multidetector CT imaging of the abdomen and pelvis was performed following the standard protocol without IV contrast. COMPARISON:  None. FINDINGS: Lower chest: No acute abnormality. Hepatobiliary: No focal liver abnormality is seen. No gallstones, gallbladder wall thickening, or biliary dilatation. Pancreas: Unremarkable. No pancreatic ductal dilatation or surrounding inflammatory changes. Spleen: Normal in size without focal abnormality. Adrenals/Urinary Tract: Adrenal glands are within normal limits. No renal calculi or obstructive changes are noted. No ureteral stones are seen. The bladder is partially distended. Stomach/Bowel: Colon is well visualized and a focal lipoma is noted in the ascending colon. No obstructive  or inflammatory changes of the colon are seen. Small bowel and stomach are unremarkable. There are changes consistent with acute appendicitis. Appendix: Location: Medial to the cecum Diameter: 12 mm Appendicolith: Appendicoliths are noted centrally. Mucosal hyper-enhancement: Present Extraluminal gas: Absent Periappendiceal collection: No fluid collection is noted although periappendiceal inflammatory changes are seen. Vascular/Lymphatic: No significant vascular findings are present. No enlarged abdominal or pelvic lymph nodes. Reproductive: Uterus is within normal limits. Cystic changes are noted on the right measuring up to 2 cm. Other: Minimal free fluid is noted. Musculoskeletal: No acute or significant osseous findings. IMPRESSION: Changes consistent with acute appendicitis as described above. Electronically Signed   By: Inez Catalina M.D.   On:  08/16/2019 20:10    Procedures Procedures (including critical care time)  Medications Ordered in ED Medications - No data to display  ED Course  I have reviewed the triage vital signs and the nursing notes.  Pertinent labs & imaging results that were available during my care of the patient were reviewed by me and considered in my medical decision making (see chart for details).    MDM Rules/Calculators/A&P                      MDM:  ua shows moderate blood,  Ct scan obtasined and shows appendicitis.   Consult to Dr. Arnoldo Morale who will see and evaluate Final Clinical Impression(s) / ED Diagnoses Final diagnoses:  Acute appendicitis, unspecified acute appendicitis type    Rx / DC Orders ED Discharge Orders    None       Fransico Meadow, Hershal Coria 08/16/19 2025    Sidney Ace 08/16/19 2147    Milton Ferguson, MD 08/16/19 2228

## 2019-08-16 NOTE — Interval H&P Note (Signed)
History and Physical Interval Note:  08/16/2019 10:52 PM  Stacey Tucker  has presented today for surgery, with the diagnosis of acute appendicitis.  The various methods of treatment have been discussed with the patient and family. After consideration of risks, benefits and other options for treatment, the patient has consented to  Procedure(s): APPENDECTOMY LAPAROSCOPIC (N/A) as a surgical intervention.  The patient's history has been reviewed, patient examined, no change in status, stable for surgery.  I have reviewed the patient's chart and labs.  Questions were answered to the patient's satisfaction.     Aviva Signs

## 2019-08-16 NOTE — ED Triage Notes (Signed)
Patient presents to the ED with "harp, burning" abdominal pain extending to left and right flanks than began yesterday morning.  Patient reports nausea with no vomiting that began today.

## 2019-08-17 ENCOUNTER — Encounter (HOSPITAL_COMMUNITY): Payer: Self-pay | Admitting: General Surgery

## 2019-08-17 DIAGNOSIS — K3580 Unspecified acute appendicitis: Secondary | ICD-10-CM | POA: Diagnosis not present

## 2019-08-17 MED ORDER — DIPHENHYDRAMINE HCL 12.5 MG/5ML PO ELIX: 12.5000 mg | ORAL_SOLUTION | Freq: Four times a day (QID) | ORAL | Status: DC | PRN

## 2019-08-17 MED ORDER — ONDANSETRON HCL 4 MG/2ML IJ SOLN: 4.0000 mg | Freq: Once | INTRAMUSCULAR | Status: DC | PRN

## 2019-08-17 MED ORDER — ROCURONIUM BROMIDE 10 MG/ML (PF) SYRINGE
PREFILLED_SYRINGE | INTRAVENOUS | Status: DC | PRN
  Administered 2019-08-16: 30 mg via INTRAVENOUS

## 2019-08-17 MED ORDER — HYDROCODONE-ACETAMINOPHEN 5-325 MG PO TABS: 1.0000 | ORAL_TABLET | ORAL | Status: DC | PRN

## 2019-08-17 MED ORDER — ONDANSETRON HCL 4 MG/2ML IJ SOLN: 4.0000 mg | Freq: Four times a day (QID) | INTRAMUSCULAR | Status: DC | PRN

## 2019-08-17 MED ORDER — LEVOTHYROXINE SODIUM 100 MCG PO TABS
100.0000 ug | ORAL_TABLET | Freq: Every day | ORAL | Status: DC
  Administered 2019-08-17: 100 ug via ORAL
  Filled 2019-08-17: qty 1

## 2019-08-17 MED ORDER — SODIUM CHLORIDE 0.9 % IV SOLN: INTRAVENOUS | Status: DC

## 2019-08-17 MED ORDER — HYDROMORPHONE HCL 1 MG/ML IJ SOLN: 0.2500 mg | INTRAMUSCULAR | Status: DC | PRN

## 2019-08-17 MED ORDER — METRONIDAZOLE IN NACL 5-0.79 MG/ML-% IV SOLN
500.0000 mg | Freq: Three times a day (TID) | INTRAVENOUS | Status: DC
  Administered 2019-08-17: 500 mg via INTRAVENOUS
  Filled 2019-08-17: qty 100

## 2019-08-17 MED ORDER — DIPHENHYDRAMINE HCL 50 MG/ML IJ SOLN: 12.5000 mg | Freq: Four times a day (QID) | INTRAMUSCULAR | Status: DC | PRN

## 2019-08-17 MED ORDER — KETOROLAC TROMETHAMINE 15 MG/ML IJ SOLN: 15.0000 mg | Freq: Four times a day (QID) | INTRAMUSCULAR | Status: DC | PRN

## 2019-08-17 MED ORDER — MEPERIDINE HCL 50 MG/ML IJ SOLN: 6.2500 mg | INTRAMUSCULAR | Status: DC | PRN

## 2019-08-17 MED ORDER — SIMETHICONE 80 MG PO CHEW: 40.0000 mg | CHEWABLE_TABLET | Freq: Four times a day (QID) | ORAL | Status: DC | PRN

## 2019-08-17 MED ORDER — ONDANSETRON 4 MG PO TBDP: 4.0000 mg | ORAL_TABLET | Freq: Four times a day (QID) | ORAL | Status: DC | PRN

## 2019-08-17 MED ORDER — EPHEDRINE SULFATE 50 MG/ML IJ SOLN
INTRAMUSCULAR | Status: DC | PRN
  Administered 2019-08-16 (×2): 10 mg via INTRAVENOUS

## 2019-08-17 MED ORDER — GLYCOPYRROLATE PF 0.2 MG/ML IJ SOSY
PREFILLED_SYRINGE | INTRAMUSCULAR | Status: DC | PRN
  Administered 2019-08-17: .2 mg via INTRAVENOUS

## 2019-08-17 MED ORDER — HYDROMORPHONE HCL 1 MG/ML IJ SOLN: 1.0000 mg | INTRAMUSCULAR | Status: DC | PRN

## 2019-08-17 MED ORDER — ARTIFICIAL TEARS OPHTHALMIC OINT: TOPICAL_OINTMENT | OPHTHALMIC | Status: DC | PRN

## 2019-08-17 MED ORDER — LACTATED RINGERS IV SOLN: INTRAVENOUS | Status: DC

## 2019-08-17 MED ORDER — SODIUM CHLORIDE 0.9 % IV SOLN: 2.0000 g | INTRAVENOUS | Status: DC

## 2019-08-17 MED ORDER — ENOXAPARIN SODIUM 40 MG/0.4ML ~~LOC~~ SOLN: 40.0000 mg | SUBCUTANEOUS | Status: DC

## 2019-08-17 MED ORDER — TRAMADOL HCL 50 MG PO TABS: 50.0000 mg | ORAL_TABLET | Freq: Four times a day (QID) | ORAL | 0 refills | Status: AC | PRN

## 2019-08-17 MED ORDER — KETOROLAC TROMETHAMINE 15 MG/ML IJ SOLN
15.0000 mg | Freq: Four times a day (QID) | INTRAMUSCULAR | Status: AC
  Administered 2019-08-17: 15 mg via INTRAVENOUS
  Filled 2019-08-17: qty 1

## 2019-08-17 MED ORDER — VENLAFAXINE HCL ER 75 MG PO CP24
75.0000 mg | ORAL_CAPSULE | Freq: Every day | ORAL | Status: DC
  Administered 2019-08-17: 75 mg via ORAL
  Filled 2019-08-17: qty 1

## 2019-08-17 MED ORDER — ACETAMINOPHEN 650 MG RE SUPP: 650.0000 mg | Freq: Four times a day (QID) | RECTAL | Status: DC | PRN

## 2019-08-17 MED ORDER — ARTIFICIAL TEARS OPHTHALMIC OINT
TOPICAL_OINTMENT | OPHTHALMIC | Status: DC | PRN
  Administered 2019-08-17: 1 via OPHTHALMIC

## 2019-08-17 MED ORDER — SUGAMMADEX SODIUM 200 MG/2ML IV SOLN
INTRAVENOUS | Status: DC | PRN
  Administered 2019-08-17: 120 mg via INTRAVENOUS

## 2019-08-17 MED ORDER — ACETAMINOPHEN 325 MG PO TABS: 650.0000 mg | ORAL_TABLET | Freq: Four times a day (QID) | ORAL | Status: DC | PRN

## 2019-08-17 MED ORDER — LORAZEPAM 2 MG/ML IJ SOLN: 1.0000 mg | INTRAMUSCULAR | Status: DC | PRN

## 2019-08-17 NOTE — Op Note (Signed)
Patient:  Stacey Tucker  DOB:  16-Aug-1950  MRN:  [id number]   Preop Diagnosis: Acute appendicitis  Postop Diagnosis: Same  Procedure: Laparoscopic appendectomy  Surgeon: Aviva Signs, MD  Anes: General endotracheal  Indications: Patient is a 69 year old white female who presents with a 24-hour history of worsening right-sided abdominal pain.  CT scan of the abdomen reveals acute appendicitis.  The risks and benefits of the procedure including bleeding, infection, and the possibility of an open procedure were fully explained to the patient, who gave informed consent.  Procedure note: The patient was placed in the supine position.  After induction of general endotracheal anesthesia, the abdomen was prepped and draped using the usual sterile technique with ChloraPrep.  Surgical site confirmation was performed.  A supraumbilical incision was made down to the fascia.  A Veress needle was introduced into the abdominal cavity and confirmation of placement was done using the saline drop test.  The abdomen was then insufflated to 15 mmHg pressure.  An 11 mm trocar was introduced into the abdominal cavity under direct visualization without difficulty.  The patient was placed in deeper Trendelenburg position and an additional 12 mm trocar was placed in the suprapubic region and a 5 mm trocar was placed in left lower quadrant region.  The appendix was visualized and noted to be acutely inflamed.  There was somewhat directly attached to the mesentery of the terminal ileum.  It was divided away along its mesentery using the harmonic scalpel.  The juncture of the appendix to the cecum was fully visualized.  A vascular Endo GIA was placed across the base the appendix and fired.  The appendix was then removed using an Endo Catch bag without difficulty.  The staple line was inspected and noted to be within normal limits.  All fluid and air were then evacuated from the abdominal cavity prior to the removal of the  trochars.  All wounds were irrigated with normal saline.  All wounds were injected with Exparel.  The supraumbilical fascia as well as suprapubic fascia were reapproximated using 0 Vicryl interrupted sutures.  All skin incisions were closed using a 4-0 Monocryl subcuticular suture.  Dermabond was applied.  All tape and needle counts were correct at the end of the procedure.  The patient was extubated in the operating room and transferred to PACU in stable condition.  Complications: None  EBL: Minimal  Specimen: Appendix

## 2019-08-17 NOTE — Plan of Care (Signed)
  Problem: Education: Goal: Knowledge of General Education information will improve Description: Including pain rating scale, medication(s)/side effects and non-pharmacologic comfort measures 08/17/2019 0821 by Santa Lighter, RN Outcome: Adequate for Discharge 08/17/2019 0820 by Santa Lighter, RN Outcome: Progressing   Problem: Health Behavior/Discharge Planning: Goal: Ability to manage health-related needs will improve 08/17/2019 0821 by Santa Lighter, RN Outcome: Adequate for Discharge 08/17/2019 0820 by Santa Lighter, RN Outcome: Progressing   Problem: Clinical Measurements: Goal: Ability to maintain clinical measurements within normal limits will improve 08/17/2019 0821 by Santa Lighter, RN Outcome: Adequate for Discharge 08/17/2019 0820 by Santa Lighter, RN Outcome: Progressing Goal: Will remain free from infection Outcome: Adequate for Discharge Goal: Diagnostic test results will improve Outcome: Adequate for Discharge Goal: Respiratory complications will improve Outcome: Adequate for Discharge Goal: Cardiovascular complication will be avoided Outcome: Adequate for Discharge   Problem: Activity: Goal: Risk for activity intolerance will decrease Outcome: Adequate for Discharge   Problem: Nutrition: Goal: Adequate nutrition will be maintained Outcome: Adequate for Discharge   Problem: Coping: Goal: Level of anxiety will decrease Outcome: Adequate for Discharge   Problem: Elimination: Goal: Will not experience complications related to bowel motility Outcome: Adequate for Discharge Goal: Will not experience complications related to urinary retention Outcome: Adequate for Discharge   Problem: Pain Managment: Goal: General experience of comfort will improve Outcome: Adequate for Discharge   Problem: Safety: Goal: Ability to remain free from injury will improve Outcome: Adequate for Discharge   Problem: Skin Integrity: Goal: Risk for impaired skin integrity will  decrease Outcome: Adequate for Discharge

## 2019-08-17 NOTE — Addendum Note (Signed)
Addendum  created 08/17/19 0108 by Denese Killings, MD   Intraprocedure Event edited

## 2019-08-17 NOTE — Discharge Instructions (Signed)
Laparoscopic Appendectomy, Adult, Care After This sheet gives you information about how to care for yourself after your procedure. Your health care provider may also give you more specific instructions. If you have problems or questions, contact your health care provider. What can I expect after the procedure? After the procedure, it is common to have:  Little energy for normal activities.  Mild pain in the area where the incisions were made.  Difficulty passing stool (constipation). This can be caused by: ? Pain medicine. ? A decrease in your activity. Follow these instructions at home: Medicines  Take over-the-counter and prescription medicines only as told by your health care provider.  If you were prescribed an antibiotic medicine, take it as told by your health care provider. Do not stop taking the antibiotic even if you start to feel better.  Do not drive or use heavy machinery while taking prescription pain medicine.  Ask your health care provider if the medicine prescribed to you can cause constipation. You may need to take steps to prevent or treat constipation, such as: ? Drink enough fluid to keep your urine pale yellow. ? Take over-the-counter or prescription medicines. ? Eat foods that are high in fiber, such as beans, whole grains, and fresh fruits and vegetables. ? Limit foods that are high in fat and processed sugars, such as fried or sweet foods. Incision care   Follow instructions from your health care provider about how to take care of your incisions. Make sure you: ? Wash your hands with soap and water before and after you change your bandage (dressing). If soap and water are not available, use hand sanitizer. ? Change your dressing as told by your health care provider. ? Leave stitches (sutures), skin glue, or adhesive strips in place. These skin closures may need to stay in place for 2 weeks or longer. If adhesive strip edges start to loosen and curl up, you may  trim the loose edges. Do not remove adhesive strips completely unless your health care provider tells you to do that.  Check your incision areas every day for signs of infection. Check for: ? Redness, swelling, or pain. ? Fluid or blood. ? Warmth. ? Pus or a bad smell. Bathing  Keep your incisions clean and dry. Clean them as often as told by your health care provider. To do this: 1. Gently wash the incisions with soap and water. 2. Rinse the incisions with water to remove all soap. 3. Pat the incisions dry with a clean towel. Do not rub the incisions.  Do not take baths, swim, or use a hot tub for 2 weeks, or until your health care provider approves. You may take showers after 48 hours. Activity   Do not drive for 24 hours if you were given a sedative during your procedure.  Rest after the procedure. Return to your normal activities as told by your health care provider. Ask your health care provider what activities are safe for you.  For 3 weeks, or for as long as told by your health care provider: ? Do not lift anything that is heavier than 10 lb (4.5 kg), or the limit that you are told. ? Do not play contact sports. General instructions  If you were sent home with a drain, follow instructions from your health care provider about how to care for it.  Take deep breaths. This helps to prevent your lungs from developing an infection (pneumonia).  Keep all follow-up visits as told by your health   care provider. This is important. Contact a health care provider if:  You have redness, swelling, or pain around an incision.  You have fluid or blood coming from an incision.  Your incision feels warm to the touch.  You have pus or a bad smell coming from an incision or dressing.  Your incision edges break open after your sutures have been removed.  You have increasing pain in your shoulders.  You feel dizzy or you faint.  You develop shortness of breath.  You keep feeling  nauseous or you are vomiting.  You have diarrhea or you cannot control your bowel functions.  You lose your appetite.  You develop swelling or pain in your legs.  You develop a rash. Get help right away if you have:  A fever.  Difficulty breathing.  Sharp pains in your chest. Summary  After a laparoscopic appendectomy, it is common to have little energy for normal activities, mild pain in the area of the incisions, and constipation.  Infection is the most common complication after this procedure. Follow your health care provider's instructions about caring for yourself after the procedure.  Rest after the procedure. Return to your normal activities as told by your health care provider.  Contact your health care provider if you notice signs of infection around your incisions or you develop shortness of breath. Get help right away if you have a fever, chest pain, or difficulty breathing. This information is not intended to replace advice given to you by your health care provider. Make sure you discuss any questions you have with your health care provider. Document Revised: 11/02/2017 Document Reviewed: 11/02/2017 Elsevier Patient Education  2020 [employer]  

## 2019-08-17 NOTE — Plan of Care (Signed)

## 2019-08-17 NOTE — Transfer of Care (Signed)
Immediate Anesthesia Transfer of Care Note  Patient: Stacey Tucker  Procedure(s) Performed: APPENDECTOMY LAPAROSCOPIC (N/A )  Patient Location: PACU  Anesthesia Type:General  Level of Consciousness: awake, alert , oriented and sedated  Airway & Oxygen Therapy: Patient Spontanous Breathing and Patient connected to nasal cannula oxygen  Post-op Assessment: Report given to RN and Post -op Vital signs reviewed and stable  Post vital signs: Reviewed and stable  Last Vitals:  Vitals Value Taken Time  BP 109/51 08/17/19 0030  Temp 36.8 C 08/17/19 0026  Pulse 81 08/17/19 0045  Resp 15 08/17/19 0045  SpO2 97 % 08/17/19 0045  Vitals shown include unvalidated device data.  Last Pain:  Vitals:   08/17/19 0026  TempSrc:   PainSc: 0-No pain         Complications: No apparent anesthesia complications

## 2019-08-17 NOTE — Progress Notes (Signed)
Nsg Discharge Note  Admit Date:  08/16/2019 Discharge date: 08/17/2019   Stacey Tucker to be D/C'd Home per MD order.  AVS completed.  Copy for chart, and copy for patient signed, and dated. Removed IV-clean, dry, intact. Reviewed d/c paperwork with patient. Answered all questions. Walked stable patient and friend to elevator to d/c to home. Patient/caregiver able to verbalize understanding.  Discharge Medication: Allergies as of 08/17/2019      Reactions   Codeine Nausea And Vomiting   Doxycycline Nausea And Vomiting   Sulfa Antibiotics Rash      Medication List    STOP taking these medications   ibuprofen 200 MG tablet Commonly known as: ADVIL   KRILL OIL PO   levothyroxine 100 MCG tablet Commonly known as: SYNTHROID   multivitamin capsule   venlafaxine XR 75 MG 24 hr capsule Commonly known as: EFFEXOR-XR     TAKE these medications   traMADol 50 MG tablet Commonly known as: Ultram Take 1 tablet (50 mg total) by mouth every 6 (six) hours as needed.       Discharge Assessment: Vitals:   08/17/19 0135 08/17/19 0600  BP: (!) 92/49 (!) 109/58  Pulse: 84 70  Resp: 20 20  Temp: 98.3 F (36.8 C) 98.2 F (36.8 C)  SpO2: 97% 95%   Skin clean, dry and intact without evidence of skin break down, no evidence of skin tears noted. IV catheter discontinued intact. Site without signs and symptoms of complications - no redness or edema noted at insertion site, patient denies c/o pain - only slight tenderness at site.  Dressing with slight pressure applied.  D/c Instructions-Education: Discharge instructions given to patient/family with verbalized understanding. D/c education completed with patient/family including follow up instructions, medication list, d/c activities limitations if indicated, with other d/c instructions as indicated by MD - patient able to verbalize understanding, all questions fully answered. Patient instructed to return to ED, call 911, or call MD for any  changes in condition.  Patient escorted via [place], and D/C home via private auto.  Santa Lighter, RN 08/17/2019 10:38 AM

## 2019-08-17 NOTE — Anesthesia Procedure Notes (Signed)
Procedure Name: Intubation Date/Time: 08/16/2019 11:33 PM Performed by: Denese Killings, MD Pre-anesthesia Checklist: Patient identified, Emergency Drugs available, Suction available and Patient being monitored Patient Re-evaluated:Patient Re-evaluated prior to induction Oxygen Delivery Method: Circle system utilized Preoxygenation: Pre-oxygenation with 100% oxygen Induction Type: IV induction and Rapid sequence Laryngoscope Size: Mac and 3 Tube type: Oral Number of attempts: 1 Airway Equipment and Method: Stylet Placement Confirmation: ETT inserted through vocal cords under direct vision,  positive ETCO2 and breath sounds checked- equal and bilateral Secured at: 21 cm Tube secured with: Tape Dental Injury: Teeth and Oropharynx as per pre-operative assessment

## 2019-08-17 NOTE — Anesthesia Postprocedure Evaluation (Signed)
Anesthesia Post Note  Patient: Stacey Tucker  Procedure(s) Performed: APPENDECTOMY LAPAROSCOPIC (N/A )  Patient location during evaluation: PACU Anesthesia Type: General Level of consciousness: awake, awake and alert, oriented and sedated Pain management: pain level controlled Vital Signs Assessment: post-procedure vital signs reviewed and stable Respiratory status: spontaneous breathing, respiratory function stable and patient connected to nasal cannula oxygen Cardiovascular status: blood pressure returned to baseline Postop Assessment: no apparent nausea or vomiting Anesthetic complications: no     Last Vitals:  Vitals:   08/17/19 0030 08/17/19 0045  BP: (!) 109/51 (!) 99/51  Pulse: 100 84  Resp: 20 15  Temp:    SpO2: 97% 98%    Last Pain:  Vitals:   08/17/19 0045  TempSrc:   PainSc: 0-No pain                 Abir Craine C Inesha Sow

## 2019-08-17 NOTE — Discharge Summary (Signed)
Physician Discharge Summary  Patient ID: Myndee Creque MRN: [id number] DOB/AGE: [birth year] 67 y.o.  Admit date: 08/16/2019 Discharge date: 08/17/2019  Admission Diagnoses: Acute appendicitis  Discharge Diagnoses: Same Active Problems:   Acute appendicitis Hypothyroidism  Discharged Condition: good  Hospital Course: Patient is a 69 year old white female who presented to the emergency room on 08/16/2019 with a 24-hour history of worsening right-sided abdominal pain.  CT scan of the abdomen revealed acute appendicitis.  The patient underwent laparoscopic appendectomy on 08/17/2019.  She tolerated the surgery well.  Her postoperative course has been unremarkable.  Her diet was advanced without difficulty.  The patient is being discharged home on 08/17/2019 in good and improving condition.  Treatments: surgery: Laparoscopic appendectomy on 08/17/2019  Discharge Exam: Blood pressure (!) 109/58, pulse 70, temperature 98.2 F (36.8 C), temperature source Oral, resp. rate 20, height 5\' 4"  (1.626 m), weight 65.2 kg, SpO2 95 %. General appearance: alert, cooperative and no distress Resp: clear to auscultation bilaterally Cardio: regular rate and rhythm, S1, S2 normal, no murmur, click, rub or gallop GI: Soft, incisions healing well.  Disposition: Discharge disposition: 01-Home or Self Care       Discharge Instructions    Diet - low sodium heart healthy   Complete by: As directed    Increase activity slowly   Complete by: As directed      Allergies as of 08/17/2019      Reactions   Codeine Nausea And Vomiting   Doxycycline Nausea And Vomiting   Sulfa Antibiotics Rash      Medication List    STOP taking these medications   ibuprofen 200 MG tablet Commonly known as: ADVIL   KRILL OIL PO   levothyroxine 100 MCG tablet Commonly known as: SYNTHROID   multivitamin capsule   venlafaxine XR 75 MG 24 hr capsule Commonly known as: EFFEXOR-XR     TAKE these medications   traMADol 50 MG  tablet Commonly known as: Ultram Take 1 tablet (50 mg total) by mouth every 6 (six) hours as needed.      Follow-up Information    Aviva Signs, MD Follow up.   Specialty: General Surgery Why: Will call you in 2 weeks for follow up Contact information: [id number] [place] [id number]           Signed: Aviva Signs 08/17/2019, 8:10 AM

## 2019-08-20 LAB — SURGICAL PATHOLOGY

## 2019-10-10 DIAGNOSIS — E039 Hypothyroidism, unspecified: Secondary | ICD-10-CM | POA: Diagnosis not present

## 2019-10-10 DIAGNOSIS — E782 Mixed hyperlipidemia: Secondary | ICD-10-CM | POA: Diagnosis not present

## 2019-10-16 DIAGNOSIS — M9903 Segmental and somatic dysfunction of lumbar region: Secondary | ICD-10-CM | POA: Diagnosis not present

## 2019-10-16 DIAGNOSIS — M9901 Segmental and somatic dysfunction of cervical region: Secondary | ICD-10-CM | POA: Diagnosis not present

## 2019-10-16 DIAGNOSIS — M9902 Segmental and somatic dysfunction of thoracic region: Secondary | ICD-10-CM | POA: Diagnosis not present

## 2019-10-16 DIAGNOSIS — M545 Low back pain: Secondary | ICD-10-CM | POA: Diagnosis not present

## 2019-10-16 DIAGNOSIS — M546 Pain in thoracic spine: Secondary | ICD-10-CM | POA: Diagnosis not present

## 2019-10-16 DIAGNOSIS — M542 Cervicalgia: Secondary | ICD-10-CM | POA: Diagnosis not present

## 2019-10-17 DIAGNOSIS — S76311D Strain of muscle, fascia and tendon of the posterior muscle group at thigh level, right thigh, subsequent encounter: Secondary | ICD-10-CM | POA: Diagnosis not present

## 2019-10-17 DIAGNOSIS — F331 Major depressive disorder, recurrent, moderate: Secondary | ICD-10-CM | POA: Diagnosis not present

## 2019-10-17 DIAGNOSIS — K648 Other hemorrhoids: Secondary | ICD-10-CM | POA: Diagnosis not present

## 2019-10-17 DIAGNOSIS — E039 Hypothyroidism, unspecified: Secondary | ICD-10-CM | POA: Diagnosis not present

## 2019-10-17 DIAGNOSIS — E782 Mixed hyperlipidemia: Secondary | ICD-10-CM | POA: Diagnosis not present

## 2019-10-17 DIAGNOSIS — Z0001 Encounter for general adult medical examination with abnormal findings: Secondary | ICD-10-CM | POA: Diagnosis not present

## 2019-11-11 ENCOUNTER — Other Ambulatory Visit (HOSPITAL_COMMUNITY): Payer: Self-pay | Admitting: Internal Medicine

## 2019-11-11 DIAGNOSIS — Z1231 Encounter for screening mammogram for malignant neoplasm of breast: Secondary | ICD-10-CM

## 2019-11-29 ENCOUNTER — Ambulatory Visit (HOSPITAL_COMMUNITY)
Admission: RE | Admit: 2019-11-29 | Discharge: 2019-11-29 | Disposition: A | Discharge status: Home / Self Care | Payer: Medicare Other | Source: Ambulatory Visit | Attending: Internal Medicine | Admitting: Internal Medicine

## 2019-11-29 ENCOUNTER — Other Ambulatory Visit: Payer: Self-pay

## 2019-11-29 DIAGNOSIS — Z1231 Encounter for screening mammogram for malignant neoplasm of breast: Secondary | ICD-10-CM | POA: Insufficient documentation

## 2019-12-10 DIAGNOSIS — L0390 Cellulitis, unspecified: Secondary | ICD-10-CM | POA: Diagnosis not present

## 2019-12-10 DIAGNOSIS — Z23 Encounter for immunization: Secondary | ICD-10-CM | POA: Diagnosis not present

## 2019-12-10 DIAGNOSIS — W5501XA Bitten by cat, initial encounter: Secondary | ICD-10-CM | POA: Diagnosis not present

## 2020-03-04 DIAGNOSIS — Z634 Disappearance and death of family member: Secondary | ICD-10-CM | POA: Diagnosis not present

## 2020-03-04 DIAGNOSIS — F331 Major depressive disorder, recurrent, moderate: Secondary | ICD-10-CM | POA: Diagnosis not present

## 2020-03-04 DIAGNOSIS — E039 Hypothyroidism, unspecified: Secondary | ICD-10-CM | POA: Diagnosis not present

## 2020-04-27 DIAGNOSIS — F331 Major depressive disorder, recurrent, moderate: Secondary | ICD-10-CM | POA: Diagnosis not present

## 2020-04-27 DIAGNOSIS — Z6822 Body mass index (BMI) 22.0-22.9, adult: Secondary | ICD-10-CM | POA: Diagnosis not present

## 2020-04-27 DIAGNOSIS — K648 Other hemorrhoids: Secondary | ICD-10-CM | POA: Diagnosis not present

## 2020-04-27 DIAGNOSIS — E039 Hypothyroidism, unspecified: Secondary | ICD-10-CM | POA: Diagnosis not present

## 2020-04-27 DIAGNOSIS — F411 Generalized anxiety disorder: Secondary | ICD-10-CM | POA: Diagnosis not present

## 2022-01-29 IMAGING — MG DIGITAL SCREENING BILAT W/ TOMO W/ CAD
8 series · 8 of 24 positions shown · non-contrast
Comparison: Previous exam(s).

CLINICAL DATA: Screening.

EXAM:
DIGITAL SCREENING BILATERAL MAMMOGRAM WITH TOMO AND CAD

[L MLO synth-2D]
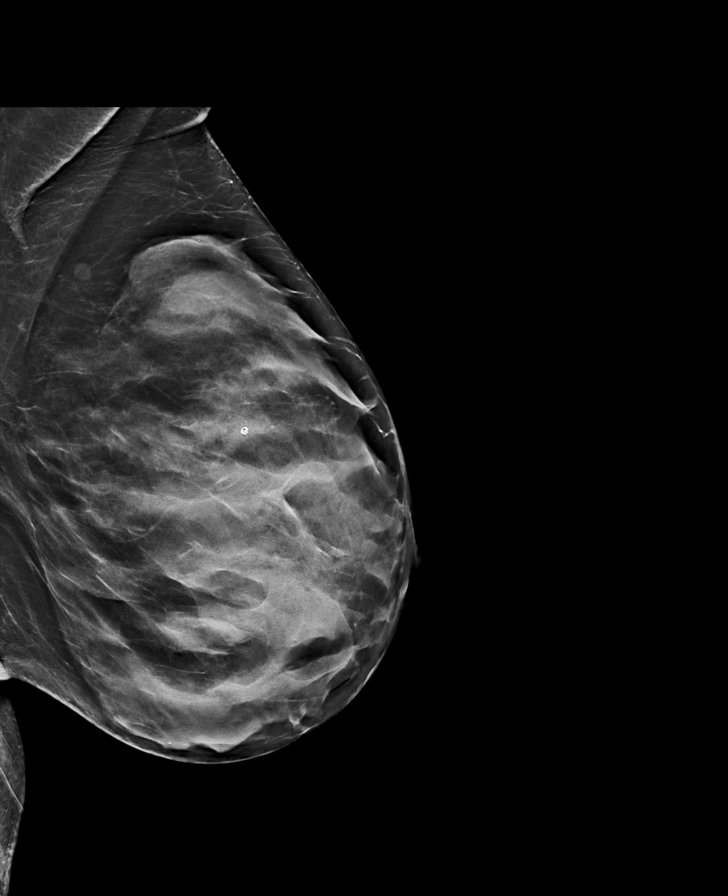

[R MLO synth-2D]
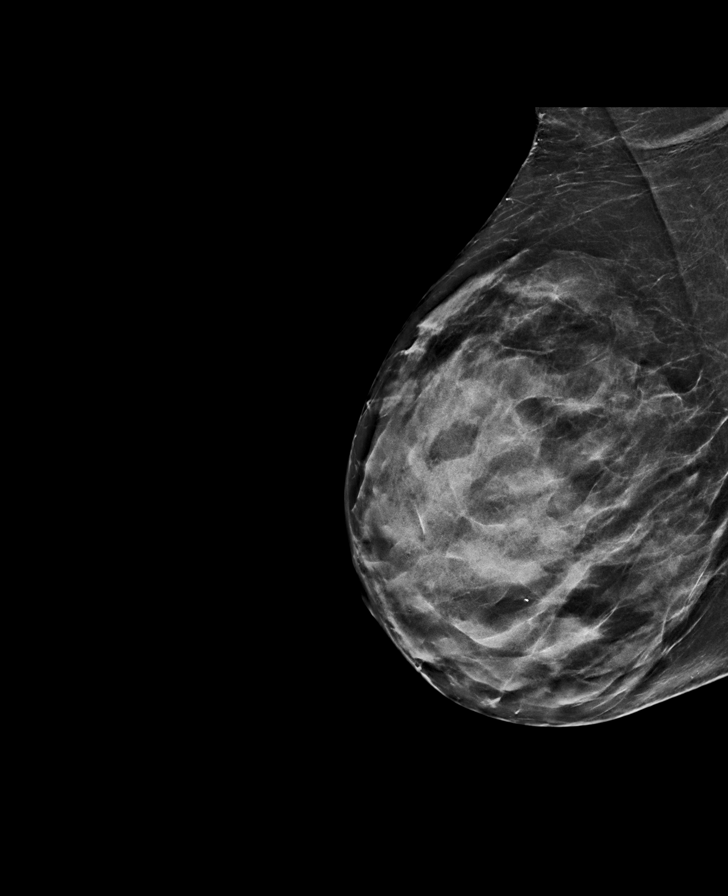

[R CC synth-2D]
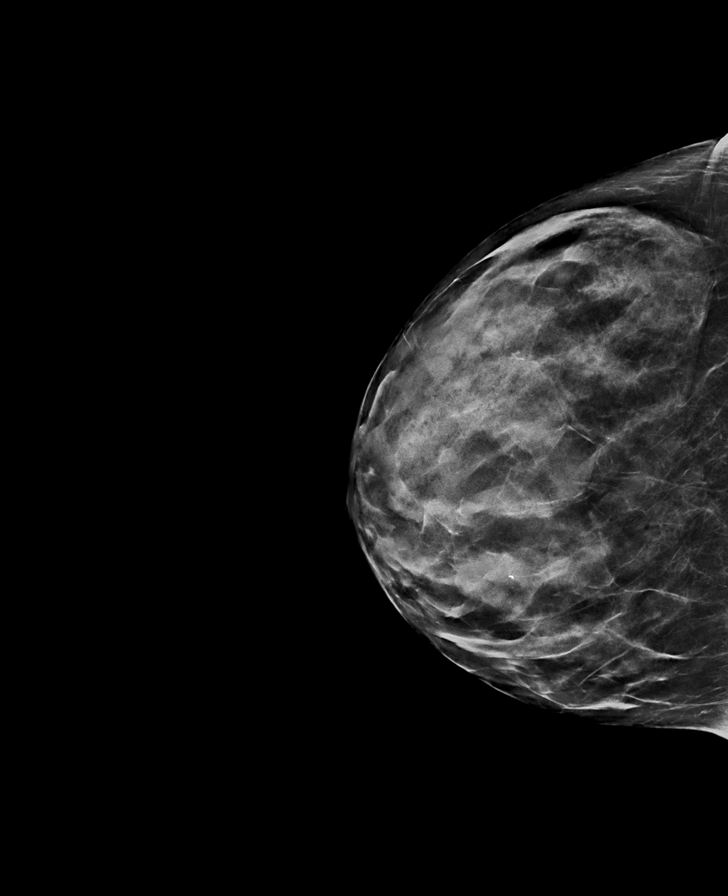

[L CC synth-2D]
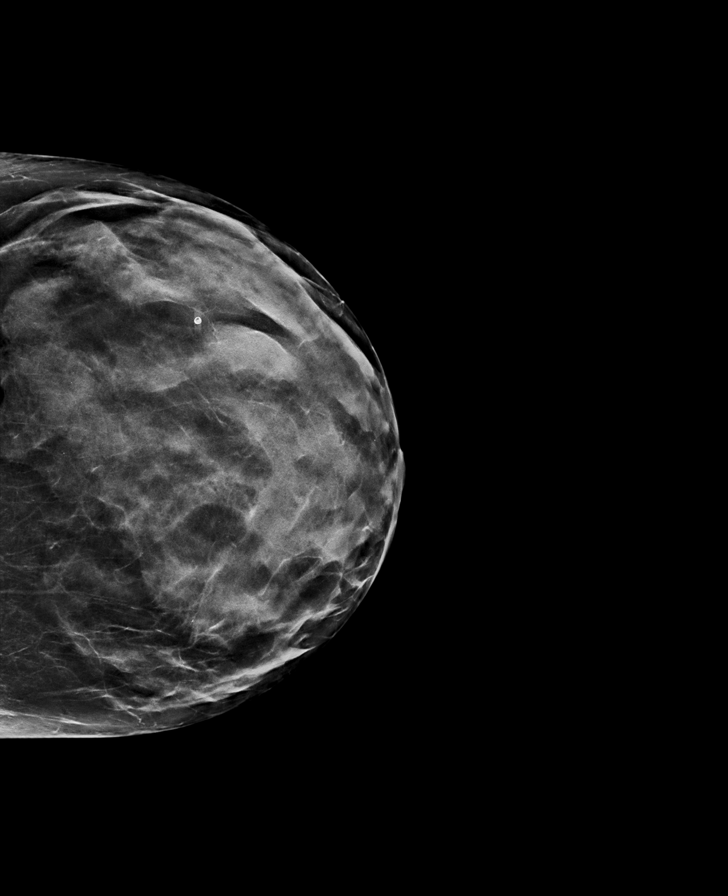

[R CC tomo · tomo slice 29/58.0]
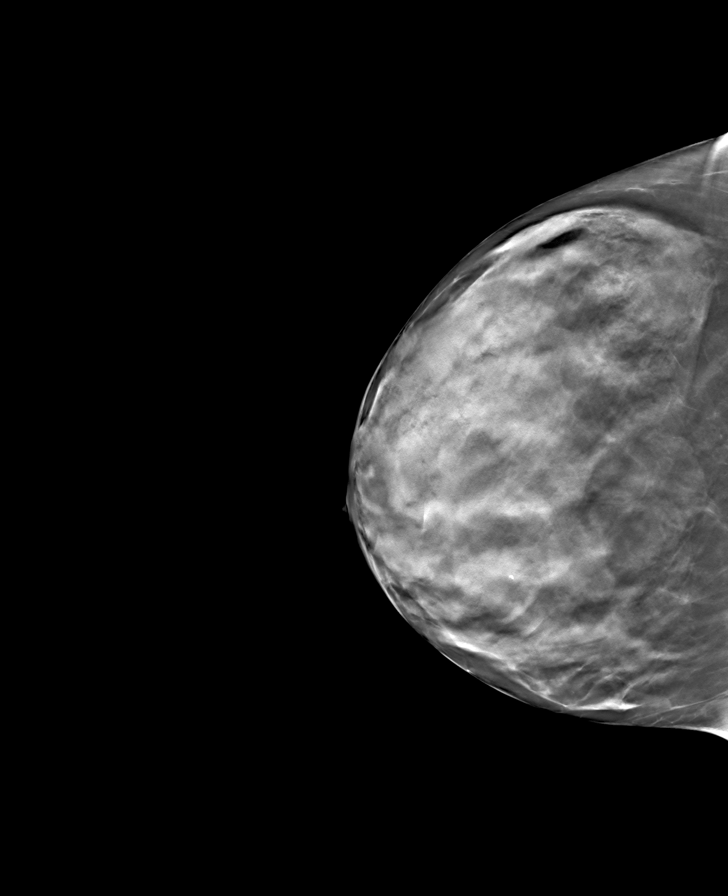

[L CC tomo · tomo slice 31/62.0]
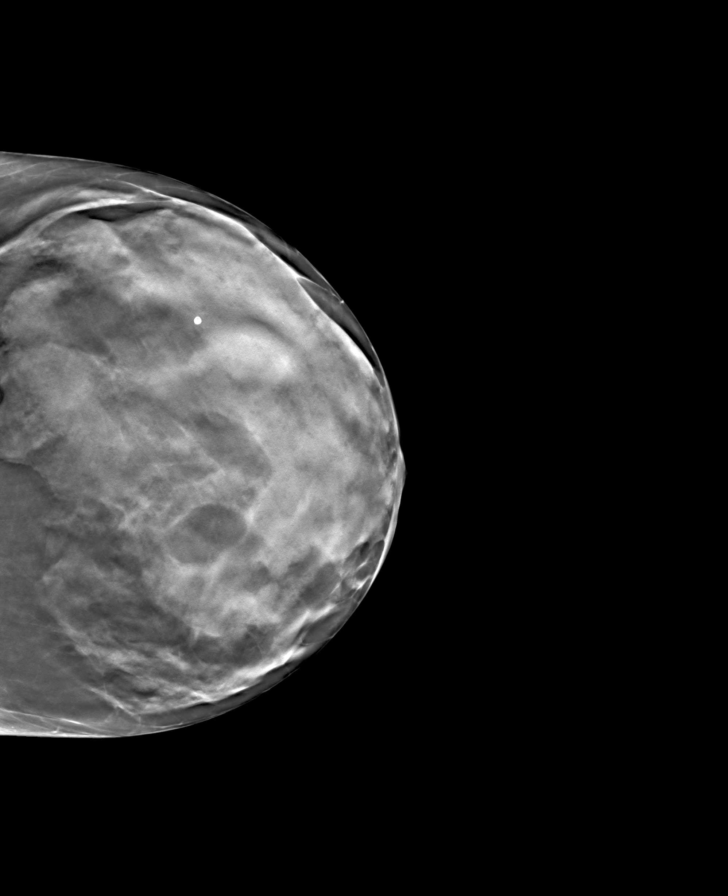

[L MLO tomo · tomo slice 34/67.0]
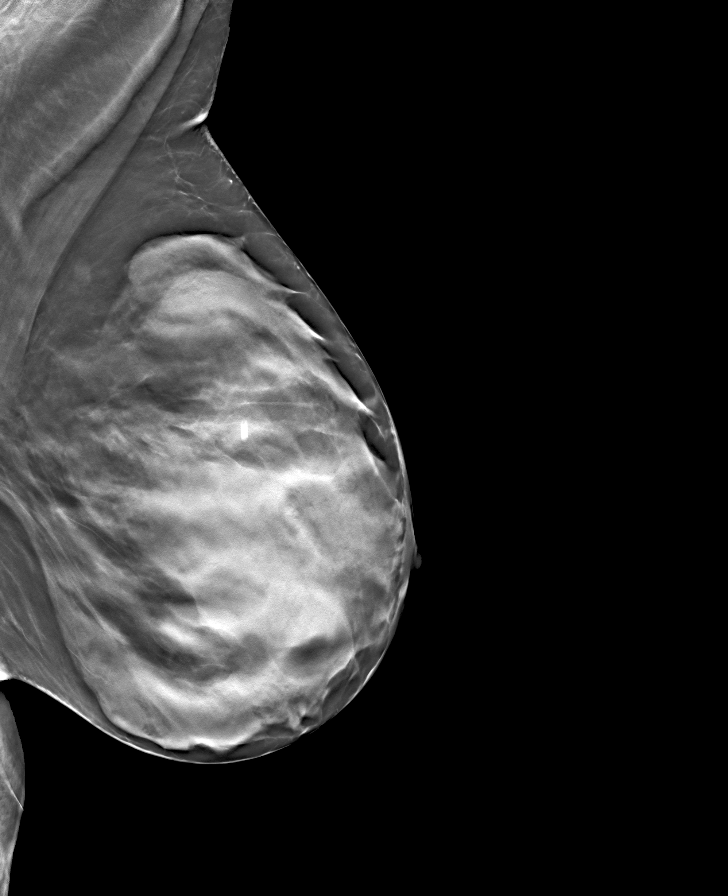

[R MLO tomo · tomo slice 31/60.0]
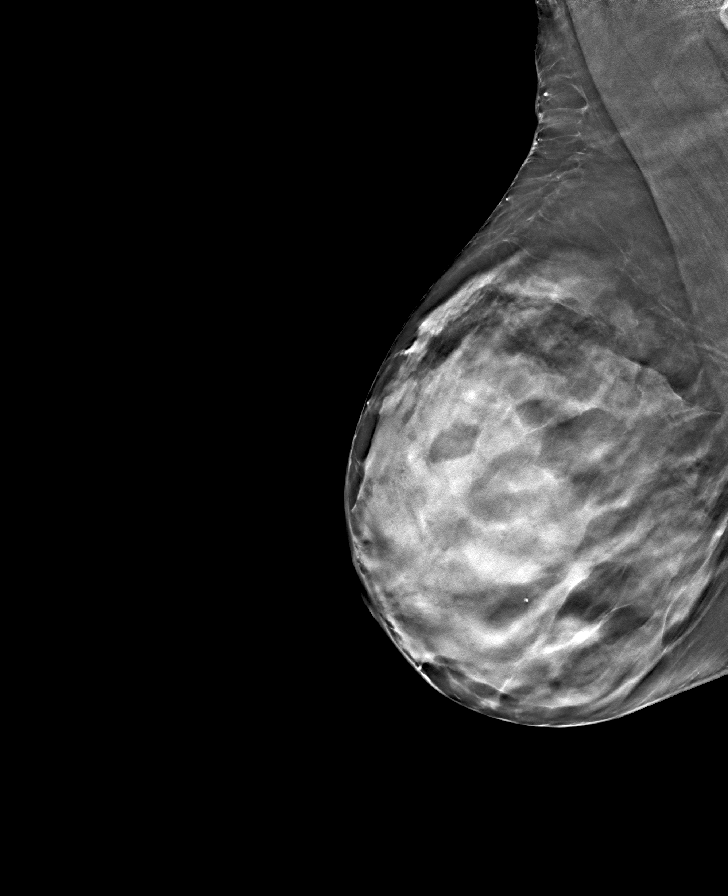

[8 of 24 positions shown; findings below may reference images not displayed]

ACR Breast Density Category c: The breast tissue is heterogeneously
dense, which may obscure small masses.
FINDINGS: There are no findings suspicious for malignancy. Images were
processed with CAD.
IMPRESSION: No mammographic evidence of malignancy. A result letter of this
screening mammogram will be mailed directly to the patient.

RECOMMENDATION:
Screening mammogram in one year. (Code:[id number])

BI-RADS CATEGORY  1: Negative.
